# Patient Record
Sex: Female | Born: 2000 | Race: Black or African American | Hispanic: No | Marital: Single | Smoking: Former smoker
Health system: Southern US, Community
[De-identification: ages and names within clinical notes are randomized; demographics above are authoritative.]

## PROBLEM LIST (undated history)

## (undated) DIAGNOSIS — D649 Anemia, unspecified: Secondary | ICD-10-CM

## (undated) HISTORY — DX: Anemia, unspecified: D64.9

## (undated) HISTORY — PX: NO PAST SURGERIES: SHX2092

---
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
## 2019-12-03 ENCOUNTER — Ambulatory Visit (HOSPITAL_COMMUNITY)
Admission: EM | Admit: 2019-12-03 | Discharge: 2019-12-03 | Disposition: A | Discharge status: Home / Self Care | Payer: BC Managed Care – PPO | Attending: Emergency Medicine | Admitting: Emergency Medicine

## 2019-12-03 ENCOUNTER — Other Ambulatory Visit: Payer: Self-pay

## 2019-12-03 ENCOUNTER — Encounter (HOSPITAL_COMMUNITY): Payer: Self-pay

## 2019-12-03 DIAGNOSIS — N390 Urinary tract infection, site not specified: Secondary | ICD-10-CM | POA: Diagnosis not present

## 2019-12-03 DIAGNOSIS — Z3202 Encounter for pregnancy test, result negative: Secondary | ICD-10-CM

## 2019-12-03 DIAGNOSIS — R300 Dysuria: Secondary | ICD-10-CM

## 2019-12-03 LAB — POCT URINALYSIS DIP (DEVICE)
Bilirubin Urine: NEGATIVE
Glucose, UA: 250 mg/dL — AB
Hgb urine dipstick: NEGATIVE
Leukocytes,Ua: NEGATIVE
Nitrite: POSITIVE — AB
Protein, ur: NEGATIVE mg/dL
Specific Gravity, Urine: 1.02 (ref 1.005–1.030)
Urobilinogen, UA: 4 mg/dL — ABNORMAL HIGH (ref 0.0–1.0)
pH: 7 (ref 5.0–8.0)

## 2019-12-03 LAB — POC URINE PREG, ED: Preg Test, Ur: NEGATIVE

## 2019-12-03 MED ORDER — IBUPROFEN 800 MG PO TABS
800.0000 mg | ORAL_TABLET | Freq: Three times a day (TID) | ORAL | 0 refills | Status: DC
Start: 2019-12-03 — End: 2021-03-12

## 2019-12-03 MED ORDER — CEPHALEXIN 500 MG PO CAPS: 500.0000 mg | ORAL_CAPSULE | Freq: Four times a day (QID) | ORAL | 0 refills | Status: AC

## 2019-12-03 NOTE — Discharge Instructions (Signed)
Drink plenty of water to empty bladder regularly. Avoid alcohol and caffeine as these may irritate the bladder.   Complete course of antibiotics.  We will call you if your culture indicates any need to change antibiotics.  I do recommend following up with gynecology to establish care and follow up

## 2019-12-03 NOTE — ED Triage Notes (Signed)
Pt c/o dysuria sx for approx 1 week, urgency, frequency and pt states she has been getting UTIs each month for several months.   Also reports lower abdominal/pelvic pain last night.  Denies fever, chills, flank/back pain, n/v/d.  Reports she was given Rx last week and thought she was only supposed to take 1 pill/daily, and reports symptoms persisted. Pt states she had online consult and was given new RX of Bactrim on 11/28/19.

## 2019-12-04 LAB — URINE CULTURE: Culture: NO GROWTH

## 2019-12-04 NOTE — ED Provider Notes (Signed)
[facility]    CSN: [id number] Arrival date & time: 12/03/19  1125      History   Chief Complaint Chief Complaint  Patient presents with  . Dysuria    HPI Alexandra Lyons is a 19 y.o. female.   Alexandra Lyons presents with complaints of dysuria and urinary urgency which started worsened last night. She started on bactrim on 7/25 following a telehealth visit, for urinary symptoms. She has had a uti every month for the past three months, she has done a telehealth visit each time and received a variety of different antibiotics. Feels like the bactrim is not helping this time, however. No back pain. No fevers. No diarrhea. No abdominal pain. No nausea or vomiting. She has been taking AZO for pain. Doesn't have regular periods. She is sexually active. She doesn't have a PCP.    ROS per HPI, negative if not otherwise mentioned.      Past Medical History:  Diagnosis Date  . Anemia     There are no problems to display for this patient.   History reviewed. No pertinent surgical history.  OB History   No obstetric history on file.      Home Medications    Prior to Admission medications   Medication Sig Start Date End Date Taking? Authorizing Provider  sulfamethoxazole-trimethoprim (BACTRIM) 200-40 MG/5ML suspension Take by mouth 2 (two) times daily.   Yes [provider]  cephALEXin (KEFLEX) 500 MG capsule Take 1 capsule (500 mg total) by mouth 4 (four) times daily for 7 days. 12/03/19 12/10/19  Linus Mako B, NP  ibuprofen (ADVIL) 800 MG tablet Take 1 tablet (800 mg total) by mouth 3 (three) times daily. 12/03/19   Georgetta Haber, NP  phenazopyridine (PYRIDIUM) 200 MG tablet Take 1 tablet (200 mg total) by mouth 3 (three) times daily. 06/16/17   Mardella Layman, MD    Family History Family History  Problem Relation Age of Onset  . Diabetes Paternal Grandmother   . Brain cancer Other     Social History Social History   Tobacco Use  .  Smoking status: Never Smoker  . Smokeless tobacco: Never Used  Substance Use Topics  . Alcohol use: Yes  . Drug use: No     Allergies   Patient has no known allergies.   Review of Systems Review of Systems   Physical Exam Triage Vital Signs ED Triage Vitals  Enc Vitals Group     BP 12/03/19 1238 (!) 105/54     Pulse Rate 12/03/19 1238 70     Resp 12/03/19 1238 18     Temp 12/03/19 1238 98.4 F (36.9 C)     Temp Source 12/03/19 1238 Oral     SpO2 12/03/19 1238 100 %     Weight --      Height --      Head Circumference --      Peak Flow --      Pain Score 12/03/19 1235 7     Pain Loc --      Pain Edu? --      Excl. in GC? --    No data found.  Updated Vital Signs BP (!) 105/54 (BP Location: Right Arm)   Pulse 70   Temp 98.4 F (36.9 C) (Oral)   Resp 18   LMP 07/05/2019 (Approximate)   SpO2 100%   Visual Acuity Right Eye Distance:   Left Eye Distance:   Bilateral Distance:  Right Eye Near:   Left Eye Near:    Bilateral Near:     Physical Exam Constitutional:      General: She is not in acute distress.    Appearance: She is well-developed.  Cardiovascular:     Rate and Rhythm: Normal rate.  Pulmonary:     Effort: Pulmonary effort is normal.  Abdominal:     Tenderness: There is no right CVA tenderness or left CVA tenderness.  Skin:    General: Skin is warm and dry.  Neurological:     Mental Status: She is alert and oriented to person, place, and time.      UC Treatments / Results  Labs (all labs ordered are listed, but only abnormal results are displayed) Labs Reviewed  POCT URINALYSIS DIP (DEVICE) - Abnormal; Notable for the following components:      Result Value   Glucose, UA 250 (*)    Ketones, ur TRACE (*)    Urobilinogen, UA 4.0 (*)    Nitrite POSITIVE (*)    All other components within normal limits  URINE CULTURE  POC URINE PREG, ED    EKG   Radiology No results found.  Procedures Procedures (including critical care  time)  Medications Ordered in UC Medications - No data to display  Initial Impression / Assessment and Plan / UC Course  I have reviewed the triage vital signs and the nursing notes.  Pertinent labs & imaging results that were available during my care of the patient were reviewed by me and considered in my medical decision making (see chart for details).     Has been taking bactrim for 5 days without any improvement, and with worsening of urinary symptoms, still with nitrite to urine. Will switch to keflex with culture pending. Return precautions provided. Patient verbalized understanding and agreeable to plan.  Encouraged establish with PCP.  Final Clinical Impressions(s) / UC Diagnoses   Final diagnoses:  Lower urinary tract infectious disease     Discharge Instructions     Drink plenty of water to empty bladder regularly. Avoid alcohol and caffeine as these may irritate the bladder.   Complete course of antibiotics.  We will call you if your culture indicates any need to change antibiotics.  I do recommend following up with gynecology to establish care and follow up   ED Prescriptions    Medication Sig Dispense Auth. Provider   cephALEXin (KEFLEX) 500 MG capsule Take 1 capsule (500 mg total) by mouth 4 (four) times daily for 7 days. 28 capsule Linus Mako B, NP   ibuprofen (ADVIL) 800 MG tablet Take 1 tablet (800 mg total) by mouth 3 (three) times daily. 30 tablet Georgetta Haber, NP     PDMP not reviewed this encounter.   Georgetta Haber, NP 12/04/19 2110

## 2020-10-31 NOTE — Progress Notes (Signed)
11:40 a-Called Pt for New OB Intake, no answer, left VM.This encounter was created in error - please disregard.

## 2020-11-23 ENCOUNTER — Ambulatory Visit (INDEPENDENT_AMBULATORY_CARE_PROVIDER_SITE_OTHER): Payer: Medicaid Other | Admitting: Obstetrics and Gynecology

## 2020-11-23 ENCOUNTER — Other Ambulatory Visit: Payer: Self-pay

## 2020-11-23 ENCOUNTER — Encounter: Payer: Self-pay | Admitting: Obstetrics and Gynecology

## 2020-11-23 ENCOUNTER — Other Ambulatory Visit (HOSPITAL_COMMUNITY)
Admission: RE | Admit: 2020-11-23 | Discharge: 2020-11-23 | Disposition: A | Discharge status: Home / Self Care | Payer: Medicaid Other | Source: Ambulatory Visit | Attending: Obstetrics and Gynecology | Admitting: Obstetrics and Gynecology

## 2020-11-23 VITALS — BP 108/73 | HR 84 | Wt 179.7 lb

## 2020-11-23 DIAGNOSIS — Z3403 Encounter for supervision of normal first pregnancy, third trimester: Secondary | ICD-10-CM | POA: Insufficient documentation

## 2020-11-23 DIAGNOSIS — Z3201 Encounter for pregnancy test, result positive: Secondary | ICD-10-CM

## 2020-11-23 DIAGNOSIS — Z3480 Encounter for supervision of other normal pregnancy, unspecified trimester: Secondary | ICD-10-CM | POA: Diagnosis not present

## 2020-11-23 DIAGNOSIS — Z5941 Food insecurity: Secondary | ICD-10-CM | POA: Diagnosis not present

## 2020-11-23 LAB — POCT PREGNANCY, URINE: Preg Test, Ur: POSITIVE — AB

## 2020-11-23 NOTE — Addendum Note (Signed)
Addended by: Guy Begin on: 11/23/2020 04:32 PM   Modules accepted: Orders

## 2020-11-23 NOTE — Progress Notes (Signed)
  Subjective:    Alexandra Lyons is a G1P0 [redacted]w[redacted]d being seen today for her first obstetrical visit.  Her obstetrical history is significant for first pregnancy. Patient does intend to breast feed. Pregnancy history fully reviewed.  Patient reports no complaints.  Vitals:   11/23/20 1010  BP: 108/73  Pulse: 84  Weight: 179 lb 11.2 oz (81.5 kg)    HISTORY: OB History  Gravida Para Term Preterm AB Living  1            SAB IAB Ectopic Multiple Live Births               # Outcome Date GA Lbr Len/2nd Weight Sex Delivery Anes PTL Lv  1 Current            Past Medical History:  Diagnosis Date   Anemia    Past Surgical History:  Procedure Laterality Date   NO PAST SURGERIES     Family History  Problem Relation Age of Onset   Diabetes Paternal Grandmother    Brain cancer Other      Exam    Uterus:     Pelvic Exam:    Perineum: No Hemorrhoids, Normal Perineum   Vulva: normal   Vagina:  normal mucosa, normal discharge   pH:    Cervix: nulliparous appearance and closed and long   Adnexa: normal adnexa and no mass, fullness, tenderness   Bony Pelvis: gynecoid  System: Breast:  normal appearance, no masses or tenderness   Skin: normal coloration and turgor, no rashes    Neurologic: oriented, no focal deficits   Extremities: normal strength, tone, and muscle mass   HEENT extra ocular movement intact   Mouth/Teeth mucous membranes moist, pharynx normal without lesions and dental hygiene good   Neck supple and no masses   Cardiovascular: regular rate and rhythm   Respiratory:  appears well, vitals normal, no respiratory distress, acyanotic, normal RR, chest clear, no wheezing, crepitations, rhonchi, normal symmetric air entry   Abdomen: soft, non-tender; bowel sounds normal; no masses,  no organomegaly   Urinary:       Assessment:    Pregnancy: G1P0 There are no problems to display for this patient.       Plan:     Initial labs drawn. Prenatal  vitamins. Problem list reviewed and updated. Genetic Screening discussed : panorama ordered.  Ultrasound discussed; fetal survey: ordered.  Follow up in 4 weeks. 50% of 30 min visit spent on counseling and coordination of care.     Janelly Switalski 11/23/2020

## 2020-11-23 NOTE — Patient Instructions (Signed)
Second Trimester of Pregnancy °The second trimester of pregnancy is from week 13 through week 27. This is months 4 through 6 of pregnancy. The second trimester is often a time when you feel your best. Your body has adjusted to being pregnant, and you begin to feel better physically. °During the second trimester: °Morning sickness has lessened or stopped completely. °You may have more energy. °You may have an increase in appetite. °The second trimester is also a time when the unborn baby (fetus) is growing rapidly. At the end of the sixth month, the fetus may be up to 12 inches long and weigh about 1½ pounds. You will likely begin to feel the baby move (quickening) between 16 and 20 weeks of pregnancy. °Body changes during your second trimester °Your body continues to go through many changes during your second trimester. The changes vary and generally return to normal after the baby is born. °Physical changes °Your weight will continue to increase. You will notice your lower abdomen bulging out. °You may begin to get stretch marks on your hips, abdomen, and breasts. °Your breasts will continue to grow and to become tender. °Dark spots or blotches (chloasma or mask of pregnancy) may develop on your face. °A dark line from your belly button to the pubic area (linea nigra) may appear. °You may have changes in your hair. These can include thickening of your hair, rapid growth, and changes in texture. Some people also have hair loss during or after pregnancy, or hair that feels dry or thin. °Health changes °You may develop headaches. °You may have heartburn. °You may develop constipation. °You may develop hemorrhoids or swollen, bulging veins (varicose veins). °Your gums may bleed and may be sensitive to brushing and flossing. °You may urinate more often because the fetus is pressing on your bladder. °You may have back pain. This is caused by: °Weight gain. °Pregnancy hormones that are relaxing the joints in your  pelvis. °A shift in weight and the muscles that support your balance. °Follow these instructions at home: °Medicines °Follow your health care provider's instructions regarding medicine use. Specific medicines may be either safe or unsafe to take during pregnancy. Do not take any medicines unless approved by your health care provider. °Take a prenatal vitamin that contains at least 600 micrograms (mcg) of folic acid. °Eating and drinking °Eat a healthy diet that includes fresh fruits and vegetables, whole grains, good sources of protein such as meat, eggs, or tofu, and low-fat dairy products. °Avoid raw meat and unpasteurized juice, milk, and cheese. These carry germs that can harm you and your baby. °You may need to take these actions to prevent or treat constipation: °Drink enough fluid to keep your urine pale yellow. °Eat foods that are high in fiber, such as beans, whole grains, and fresh fruits and vegetables. °Limit foods that are high in fat and processed sugars, such as fried or sweet foods. °Activity °Exercise only as directed by your health care provider. Most people can continue their usual exercise routine during pregnancy. Try to exercise for 30 minutes at least 5 days a week. Stop exercising if you develop contractions in your uterus. °Stop exercising if you develop pain or cramping in the lower abdomen or lower back. °Avoid exercising if it is very hot or humid or if you are at a high altitude. °Avoid heavy lifting. °If you choose to, you may have sex unless your health care provider tells you not to. °Relieving pain and discomfort °Wear a supportive bra   to prevent discomfort from breast tenderness. °Take warm sitz baths to soothe any pain or discomfort caused by hemorrhoids. Use hemorrhoid cream if your health care provider approves. °Rest with your legs raised (elevated) if you have leg cramps or low back pain. °If you develop varicose veins: °Wear support hose as told by your health care  provider. °Elevate your feet for 15 minutes, 3-4 times a day. °Limit salt in your diet. °Safety °Wear your seat belt at all times when driving or riding in a car. °Talk with your health care provider if someone is verbally or physically abusive to you. °Lifestyle °Do not use hot tubs, steam rooms, or saunas. °Do not douche. Do not use tampons or scented sanitary pads. °Avoid cat litter boxes and soil used by cats. These carry germs that can cause birth defects in the baby and possibly loss of the fetus by miscarriage or stillbirth. °Do not use herbal remedies, alcohol, illegal drugs, or medicines that are not approved by your health care provider. Chemicals in these products can harm your baby. °Do not use any products that contain nicotine or tobacco, such as cigarettes, e-cigarettes, and chewing tobacco. If you need help quitting, ask your health care provider. °General instructions °During a routine prenatal visit, your health care provider will do a physical exam and other tests. He or she will also discuss your overall health. Keep all follow-up visits. This is important. °Ask your health care provider for a referral to a local prenatal education class. °Ask for help if you have counseling or nutritional needs during pregnancy. Your health care provider can offer advice or refer you to specialists for help with various needs. °Where to find more information °American Pregnancy Association: americanpregnancy.org °American College of Obstetricians and Gynecologists: acog.org/en/Womens%20Health/Pregnancy °Office on Women's Health: womenshealth.gov/pregnancy °Contact a health care provider if you have: °A headache that does not go away when you take medicine. °Vision changes or you see spots in front of your eyes. °Mild pelvic cramps, pelvic pressure, or nagging pain in the abdominal area. °Persistent nausea, vomiting, or diarrhea. °A bad-smelling vaginal discharge or foul-smelling urine. °Pain when you  urinate. °Sudden or extreme swelling of your face, hands, ankles, feet, or legs. °A fever. °Get help right away if you: °Have fluid leaking from your vagina. °Have spotting or bleeding from your vagina. °Have severe abdominal cramping or pain. °Have difficulty breathing. °Have chest pain. °Have fainting spells. °Have not felt your baby move for the time period told by your health care provider. °Have new or increased pain, swelling, or redness in an arm or leg. °Summary °The second trimester of pregnancy is from week 13 through week 27 (months 4 through 6). °Do not use herbal remedies, alcohol, illegal drugs, or medicines that are not approved by your health care provider. Chemicals in these products can harm your baby. °Exercise only as directed by your health care provider. Most people can continue their usual exercise routine during pregnancy. °Keep all follow-up visits. This is important. °This information is not intended to replace advice given to you by your health care provider. Make sure you discuss any questions you have with your health care provider. °Document Revised: 09/29/2019 Document Reviewed: 08/05/2019 °Elsevier Patient Education © 2022 [employer] ° °Contraception Choices °Contraception, also called birth control, refers to methods or devices that prevent pregnancy. °Hormonal methods °Contraceptive implant °A contraceptive implant is a thin, plastic tube that contains a hormone that prevents pregnancy. It is different from an intrauterine device (IUD). It   is inserted into the upper part of the arm by a health care provider. Implants can be effective for up to 3 years. °Progestin-only injections °Progestin-only injections are injections of progestin, a synthetic form of the hormone progesterone. They are given every 3 months by a health care provider. °Birth control pills °Birth control pills are pills that contain hormones that prevent pregnancy. They must be taken once a day, preferably at the  same time each day. A prescription is needed to use this method of contraception. °Birth control patch °The birth control patch contains hormones that prevent pregnancy. It is placed on the skin and must be changed once a week for three weeks and removed on the fourth week. A prescription is needed to use this method of contraception. °Vaginal ring °A vaginal ring contains hormones that prevent pregnancy. It is placed in the vagina for three weeks and removed on the fourth week. After that, the process is repeated with a new ring. A prescription is needed to use this method of contraception. °Emergency contraceptive °Emergency contraceptives prevent pregnancy after unprotected sex. They come in pill form and can be taken up to 5 days after sex. They work best the sooner they are taken after having sex. Most emergency contraceptives are available without a prescription. This method should not be used as your only form of birth control. °Barrier methods °Female condom °A female condom is a thin sheath that is worn over the penis during sex. Condoms keep sperm from going inside a woman's body. They can be used with a sperm-killing substance (spermicide) to increase their effectiveness. They should be thrown away after one use. °Female condom °A female condom is a soft, loose-fitting sheath that is put into the vagina before sex. The condom keeps sperm from going inside a woman's body. They should be thrown away after one use. °Diaphragm °A diaphragm is a soft, dome-shaped barrier. It is inserted into the vagina before sex, along with a spermicide. The diaphragm blocks sperm from entering the uterus, and the spermicide kills sperm. A diaphragm should be left in the vagina for 6-8 hours after sex and removed within 24 hours. °A diaphragm is prescribed and fitted by a health care provider. A diaphragm should be replaced every 1-2 years, after giving birth, after gaining more than 15 lb (6.8 kg), and after pelvic  surgery. °Cervical cap °A cervical cap is a round, soft latex or plastic cup that fits over the cervix. It is inserted into the vagina before sex, along with spermicide. It blocks sperm from entering the uterus. The cap should be left in place for 6-8 hours after sex and removed within 48 hours. A cervical cap must be prescribed and fitted by a health care provider. It should be replaced every 2 years. °Sponge °A sponge is a soft, circular piece of polyurethane foam with spermicide in it. The sponge helps block sperm from entering the uterus, and the spermicide kills sperm. To use it, you make it wet and then insert it into the vagina. It should be inserted before sex, left in for at least 6 hours after sex, and removed and thrown away within 30 hours. °Spermicides °Spermicides are chemicals that kill or block sperm from entering the cervix and uterus. They can come as a cream, jelly, suppository, foam, or tablet. A spermicide should be inserted into the vagina with an applicator at least 10-15 minutes before sex to allow time for it to work. The process must be repeated every time   you have sex. Spermicides do not require a prescription. °Intrauterine contraception °Intrauterine device (IUD) °An IUD is a T-shaped device that is put in a woman's uterus. There are two types: °Hormone IUD.This type contains progestin, a synthetic form of the hormone progesterone. This type can stay in place for 3-5 years. °Copper IUD.This type is wrapped in copper wire. It can stay in place for 10 years. °Permanent methods of contraception °Female tubal ligation °In this method, a woman's fallopian tubes are sealed, tied, or blocked during surgery to prevent eggs from traveling to the uterus. °Hysteroscopic sterilization °In this method, a small, flexible insert is placed into each fallopian tube. The inserts cause scar tissue to form in the fallopian tubes and block them, so sperm cannot reach an egg. The procedure takes about 3  months to be effective. Another form of birth control must be used during those 3 months. °Female sterilization °This is a procedure to tie off the tubes that carry sperm (vasectomy). After the procedure, the man can still ejaculate fluid (semen). Another form of birth control must be used for 3 months after the procedure. °Natural planning methods °Natural family planning °In this method, a couple does not have sex on days when the woman could become pregnant. °Calendar method °In this method, the woman keeps track of the length of each menstrual cycle, identifies the days when pregnancy can happen, and does not have sex on those days. °Ovulation method °In this method, a couple avoids sex during ovulation. °Symptothermal method °This method involves not having sex during ovulation. The woman typically checks for ovulation by watching changes in her temperature and in the consistency of cervical mucus. °Post-ovulation method °In this method, a couple waits to have sex until after ovulation. °Where to find more information °Centers for Disease Control and Prevention: www.cdc.gov °Summary °Contraception, also called birth control, refers to methods or devices that prevent pregnancy. °Hormonal methods of contraception include implants, injections, pills, patches, vaginal rings, and emergency contraceptives. °Barrier methods of contraception can include female condoms, female condoms, diaphragms, cervical caps, sponges, and spermicides. °There are two types of IUDs (intrauterine devices). An IUD can be put in a woman's uterus to prevent pregnancy for 3-5 years. °Permanent sterilization can be done through a procedure for males and females. Natural family planning methods involve nothaving sex on days when the woman could become pregnant. °This information is not intended to replace advice given to you by your health care provider. Make sure you discuss any questions you have with your health care provider. °Document  Revised: 09/27/2019 Document Reviewed: 09/27/2019 °Elsevier Patient Education © 2022 Elsevier Inc. ° °

## 2020-11-24 ENCOUNTER — Encounter: Payer: Self-pay | Admitting: *Deleted

## 2020-11-24 LAB — CBC/D/PLT+RPR+RH+ABO+RUBIGG...
Antibody Screen: NEGATIVE
Basophils Absolute: 0 10*3/uL (ref 0.0–0.2)
Basos: 0 %
EOS (ABSOLUTE): 0 10*3/uL (ref 0.0–0.4)
Eos: 0 %
HCV Ab: <0.1 s/co ratio (ref 0.0–0.9)
HIV Screen 4th Generation wRfx: NONREACTIVE
Hematocrit: 31.3 % — ABNORMAL LOW (ref 34.0–46.6)
Hemoglobin: 10.7 g/dL — ABNORMAL LOW (ref 11.1–15.9)
Hepatitis B Surface Ag: NEGATIVE
Immature Grans (Abs): 0 10*3/uL (ref 0.0–0.1)
Immature Granulocytes: 0 %
Lymphocytes Absolute: 0.9 10*3/uL (ref 0.7–3.1)
Lymphs: 21 %
MCH: 28.5 pg (ref 26.6–33.0)
MCHC: 34.2 g/dL (ref 31.5–35.7)
MCV: 84 fL (ref 79–97)
Monocytes Absolute: 0.3 10*3/uL (ref 0.1–0.9)
Monocytes: 6 %
Neutrophils Absolute: 3 10*3/uL (ref 1.4–7.0)
Neutrophils: 73 %
Platelets: 200 10*3/uL (ref 150–450)
RBC: 3.75 x10E6/uL — ABNORMAL LOW (ref 3.77–5.28)
RDW: 13 % (ref 11.7–15.4)
RPR Ser Ql: NONREACTIVE
Rh Factor: POSITIVE
Rubella Antibodies, IGG: 2.77 index (ref >0.99)
WBC: 4.1 10*3/uL (ref 3.4–10.8)

## 2020-11-24 LAB — HCV INTERPRETATION

## 2020-11-24 LAB — CERVICOVAGINAL ANCILLARY ONLY
Chlamydia: NEGATIVE
Comment: NEGATIVE
Comment: NORMAL
Neisseria Gonorrhea: NEGATIVE

## 2020-11-24 LAB — HEMOGLOBIN A1C
Est. average glucose Bld gHb Est-mCnc: 111 mg/dL
Hgb A1c MFr Bld: 5.5 % (ref 4.8–5.6)

## 2020-11-26 LAB — CULTURE, OB URINE

## 2020-11-26 LAB — URINE CULTURE, OB REFLEX

## 2020-11-27 LAB — CYTOLOGY - PAP: Diagnosis: NEGATIVE

## 2020-12-21 ENCOUNTER — Ambulatory Visit (INDEPENDENT_AMBULATORY_CARE_PROVIDER_SITE_OTHER): Payer: Medicaid Other | Admitting: Obstetrics and Gynecology

## 2020-12-21 ENCOUNTER — Other Ambulatory Visit: Payer: Self-pay

## 2020-12-21 VITALS — BP 91/51 | HR 90 | Wt 190.2 lb

## 2020-12-21 DIAGNOSIS — Z3480 Encounter for supervision of other normal pregnancy, unspecified trimester: Secondary | ICD-10-CM

## 2020-12-21 DIAGNOSIS — Z3A17 17 weeks gestation of pregnancy: Secondary | ICD-10-CM

## 2020-12-21 MED ORDER — NITROFURANTOIN MONOHYD MACRO 100 MG PO CAPS: 100.0000 mg | ORAL_CAPSULE | Freq: Two times a day (BID) | ORAL | 0 refills | Status: AC

## 2020-12-21 NOTE — Progress Notes (Signed)
   PRENATAL VISIT NOTE  Subjective:  Alexandra Lyons is a 20 y.o. G1P0 at [redacted]w[redacted]d being seen today for ongoing prenatal care.  She is currently monitored for the following issues for this low-risk pregnancy and has Supervision of other normal pregnancy, antepartum on their problem list.  Patient reports dysuria. Concerned that her urine culture in July was positive and she was not given antibiotics. Continues to have dysuria and pain in the LLQ at times after urinating. She has no flank pain or fever.  Contractions: Not present. Vag. Bleeding: None.  Movement: Absent. Denies leaking of fluid.   The following portions of the patient's history were reviewed and updated as appropriate: allergies, current medications, past family history, past medical history, past social history, past surgical history and problem list.   Objective:   Vitals:   12/21/20 0847  BP: (!) 91/51  Pulse: 90  Weight: 190 lb 3.2 oz (86.3 kg)    Fetal Status: Fetal Heart Rate (bpm): 137   Movement: Absent     General:  Alert, oriented and cooperative. Patient is in no acute distress.  Skin: Skin is warm and dry. No rash noted.   Cardiovascular: Normal heart rate noted  Respiratory: Normal respiratory effort, no problems with respiration noted  Abdomen: Soft, gravid, appropriate for gestational age.  Pain/Pressure: Present     Pelvic: Cervical exam deferred        Extremities: Normal range of motion.  Edema: None  Mental Status: Normal mood and affect. Normal behavior. Normal judgment and thought content.   Assessment and Plan:  Pregnancy: G1P0 at [redacted]w[redacted]d  1. Supervision of other normal pregnancy, antepartum  - AFP, Serum, Open Spina Bifida  - Urine culture positive on 7/21- Patient continues to have dysuria and pain in her LLQ at times after urinating. - Urine culture reviewed, Rx Macrobid.   Preterm labor symptoms and general obstetric precautions including but not limited to vaginal bleeding,  contractions, leaking of fluid and fetal movement were reviewed in detail with the patient. Please refer to After Visit Summary for other counseling recommendations.   No follow-ups on file.  Future Appointments  Date Time Provider Department Center  01/01/2021  8:45 AM [facility] US[facility]  01/19/2021  9:15 AM Adam Phenix, MD [facility]    Venia Carbon, NP

## 2020-12-22 LAB — AFP, SERUM, OPEN SPINA BIFIDA
AFP MoM: 1.49
AFP Value: 56.4 ng/mL
Gest. Age on Collection Date: 17 weeks
Maternal Age At EDD: 22.7 yr
OSBR Risk 1 IN: 5617
Test Results:: NEGATIVE
Weight: 190 [lb_av]

## 2021-01-01 ENCOUNTER — Other Ambulatory Visit: Payer: Self-pay

## 2021-01-01 ENCOUNTER — Other Ambulatory Visit: Payer: Self-pay | Admitting: *Deleted

## 2021-01-01 ENCOUNTER — Ambulatory Visit: Payer: Medicaid Other | Attending: Obstetrics and Gynecology

## 2021-01-01 DIAGNOSIS — Z3480 Encounter for supervision of other normal pregnancy, unspecified trimester: Secondary | ICD-10-CM | POA: Diagnosis present

## 2021-01-01 DIAGNOSIS — Z362 Encounter for other antenatal screening follow-up: Secondary | ICD-10-CM

## 2021-01-05 ENCOUNTER — Telehealth: Payer: Self-pay | Admitting: Lactation Services

## 2021-01-05 NOTE — Telephone Encounter (Signed)
Called patient and informed that she is a silent carrier for Alpha Thalassemia.   Reviewed calling [facility] at [phone] to schedule a telephone Genetic Counseling Session to discuss results.   Reviewed partner testing in recommended and we have kits in the office that partner can either come to the office at her next appointment to complete or can take home for him to complete.   Patient with no further questions or concerns today.

## 2021-01-19 ENCOUNTER — Other Ambulatory Visit: Payer: Self-pay

## 2021-01-19 ENCOUNTER — Ambulatory Visit (INDEPENDENT_AMBULATORY_CARE_PROVIDER_SITE_OTHER): Payer: Medicaid Other | Admitting: Obstetrics & Gynecology

## 2021-01-19 VITALS — BP 103/63 | HR 91 | Wt 194.2 lb

## 2021-01-19 DIAGNOSIS — Z3480 Encounter for supervision of other normal pregnancy, unspecified trimester: Secondary | ICD-10-CM

## 2021-01-19 DIAGNOSIS — Z3A21 21 weeks gestation of pregnancy: Secondary | ICD-10-CM

## 2021-01-19 NOTE — Progress Notes (Signed)
I, Valetta Close Erskine Steinfeldt, LCSW, have reviewed all documentation for this visit. The documentation on 01/19/21 for the exam, diagnosis, procedures, and orders are all accurate and complete.

## 2021-01-19 NOTE — Progress Notes (Signed)
Pt states has been constipated off & on.

## 2021-01-19 NOTE — Progress Notes (Signed)
   PRENATAL VISIT NOTE  Subjective:  Alexandra Lyons is a 20 y.o. G1P0 at [redacted]w[redacted]d being seen today for ongoing prenatal care.  She is currently monitored for the following issues for this low-risk pregnancy and has Supervision of other normal pregnancy, antepartum on their problem list.  Patient reports  constipation she manages with diet .  Contractions: Not present. Vag. Bleeding: None.  Movement: Present. Denies leaking of fluid.   The following portions of the patient's history were reviewed and updated as appropriate: allergies, current medications, past family history, past medical history, past social history, past surgical history and problem list.   Objective:   Vitals:   01/19/21 0938  BP: 103/63  Pulse: 91  Weight: 194 lb 3.2 oz (88.1 kg)    Fetal Status: Fetal Heart Rate (bpm): 144   Movement: Present     General:  Alert, oriented and cooperative. Patient is in no acute distress.  Skin: Skin is warm and dry. No rash noted.   Cardiovascular: Normal heart rate noted  Respiratory: Normal respiratory effort, no problems with respiration noted  Abdomen: Soft, gravid, appropriate for gestational age.  Pain/Pressure: Present     Pelvic: Cervical exam deferred        Extremities: Normal range of motion.  Edema: None  Mental Status: Normal mood and affect. Normal behavior. Normal judgment and thought content.   Assessment and Plan:  Pregnancy: G1P0 at [redacted]w[redacted]d 1. Supervision of other normal pregnancy, antepartum Constipation discussed and instructions were given  Preterm labor symptoms and general obstetric precautions including but not limited to vaginal bleeding, contractions, leaking of fluid and fetal movement were reviewed in detail with the patient. Please refer to After Visit Summary for other counseling recommendations.   Return in about 4 weeks (around 02/16/2021).  Future Appointments  Date Time Provider Department Center  01/30/2021  7:30 AM [facility] NURSE  [facility]  01/30/2021  7:45 AM [facility] US5 [facility]    Scheryl Darter, MD

## 2021-01-30 ENCOUNTER — Other Ambulatory Visit: Payer: Self-pay

## 2021-01-30 ENCOUNTER — Ambulatory Visit: Payer: Medicaid Other

## 2021-01-30 ENCOUNTER — Ambulatory Visit: Payer: Medicaid Other | Attending: Obstetrics and Gynecology

## 2021-01-30 DIAGNOSIS — Z3A23 23 weeks gestation of pregnancy: Secondary | ICD-10-CM

## 2021-01-30 DIAGNOSIS — Z362 Encounter for other antenatal screening follow-up: Secondary | ICD-10-CM | POA: Diagnosis present

## 2021-02-15 ENCOUNTER — Other Ambulatory Visit: Payer: Medicaid Other

## 2021-02-15 ENCOUNTER — Other Ambulatory Visit: Payer: Self-pay

## 2021-02-15 ENCOUNTER — Encounter: Payer: Medicaid Other | Admitting: Obstetrics & Gynecology

## 2021-02-15 DIAGNOSIS — Z3480 Encounter for supervision of other normal pregnancy, unspecified trimester: Secondary | ICD-10-CM

## 2021-03-12 ENCOUNTER — Encounter: Payer: Self-pay | Admitting: Nurse Practitioner

## 2021-03-12 ENCOUNTER — Ambulatory Visit (INDEPENDENT_AMBULATORY_CARE_PROVIDER_SITE_OTHER): Payer: Medicaid Other | Admitting: Nurse Practitioner

## 2021-03-12 ENCOUNTER — Other Ambulatory Visit: Payer: Medicaid Other

## 2021-03-12 ENCOUNTER — Other Ambulatory Visit: Payer: Self-pay

## 2021-03-12 VITALS — BP 123/69 | HR 87 | Wt 208.2 lb

## 2021-03-12 DIAGNOSIS — Z3480 Encounter for supervision of other normal pregnancy, unspecified trimester: Secondary | ICD-10-CM

## 2021-03-12 DIAGNOSIS — Z23 Encounter for immunization: Secondary | ICD-10-CM | POA: Diagnosis not present

## 2021-03-12 DIAGNOSIS — Z3A29 29 weeks gestation of pregnancy: Secondary | ICD-10-CM

## 2021-03-12 DIAGNOSIS — R8279 Other abnormal findings on microbiological examination of urine: Secondary | ICD-10-CM

## 2021-03-12 DIAGNOSIS — F338 Other recurrent depressive disorders: Secondary | ICD-10-CM | POA: Insufficient documentation

## 2021-03-12 NOTE — Patient Instructions (Addendum)
[email or web address] - childbirth and breastfeeding classes     BRAINSTORMING  Develop a Plan Goals: Provide a way to start conversation about your new life with a baby Assist parents in recognizing and using resources within their reach Help pave the way before birth for an easier period of transition afterwards.  Make a list of the following information to keep in a central location: Full name of Mom and Partner: _____________________________________________ Baby's full name and Date of Birth: ___________________________________________ Home Address: ___________________________________________________________ ________________________________________________________________________ Home Phone: ____________________________________________________________ Parents' cell numbers: _____________________________________________________ ________________________________________________________________________ Name and contact info for OB: ______________________________________________ Name and contact info for Pediatrician:________________________________________ Contact info for Lactation Consultants: ________________________________________  REST and SLEEP *You each need at least 4-5 hours of uninterrupted sleep every day. Write specific names and contact information.* How are you going to rest in the postpartum period? While partner's home? When partner returns to work? When you both return to work? Where will your baby sleep? Who is available to help during the day? Evening? Night? Who could move in for a period to help support you? What are some ideas to help you get enough sleep? __________________________________________________________________________________________________________________________________________________________________________________________________________________________________________ NUTRITIOUS FOOD AND DRINK *Plan for meals before your baby is born so you  can have healthy food to eat during the immediate postpartum period.* Who will look after breakfast? Lunch? Dinner? List names and contact information. Brainstorm quick, healthy ideas for each meal. What can you do before baby is born to prepare meals for the postpartum period? How can others help you with meals? Which grocery stores provide online shopping and delivery? Which restaurants offer take-out or delivery options? ______________________________________________________________________________________________________________________________________________________________________________________________________________________________________________________________________________________________________________________________________________________________________________________________________  CARE FOR MOM *It's important that mom is cared for and pampered in the postpartum period. Remember, the most important ways new mothers need care are: sleep, nutrition, gentle exercise, and time off.* Who can come take care of mom during this period? Make a list of people with their contact information. List some activities that make you feel cared for, rested, and energized? Who can make sure you have opportunities to do these things? Does mom have a space of her very own within your home that's just for her? Make a "Mama Cave" where she can be comfortable, rest, and renew herself daily. ______________________________________________________________________________________________________________________________________________________________________________________________________________________________________________________________________________________________________________________________________________________________________________________________________    CARE FOR AND FEEDING BABY *Knowledgeable and encouraging people will offer the best support with regard to feeding your  baby.* Educate yourself and choose the best feeding option for your baby. Make a list of people who will guide, support, and be a resource for you as your care for and feed your baby. (Friends that have breastfed or are currently breastfeeding, lactation consultants, breastfeeding support groups, etc.) Consider a postpartum doula. (These websites can give you information: dona.org & padanc.org) Seek out local breastfeeding resources like the breastfeeding support group at [facility] or [employer]. ______________________________________________________________________________________________________________________________________________________________________________________________________________________________________________________________________________________________________________________________________________________________________________________________________  CHORES AND ERRANDS Who can help with a thorough cleaning before baby is born? Make a list of people who will help with housekeeping and chores, like laundry, light cleaning, dishes, bathrooms, etc. Who can run some errands for you? What can you do to make sure you are stocked with basic supplies before baby is born? Who is going to do the shopping? ______________________________________________________________________________________________________________________________________________________________________________________________________________________________________________________________________________________________________________________________________________________________________________________________________     Family Adjustment *Nurture yourselves.it helps parents be more loving and allows for better bonding with their child.* What sorts of things do you and partner enjoy doing together? Which activities help you to connect and strengthen your relationship? Make a list of those   things. Make  a list of people whom you trust to care for your baby so you can have some time together as a couple. What types of things help partner feel connected to Mom? Make a list. What needs will partner have in order to bond with baby? Other children? Who will care for them when you go into labor and while you are in the hospital? Think about what the needs of your older children might be. Who can help you meet those needs? In what ways are you helping them prepare for bringing baby home? List some specific strategies you have for family adjustment. _______________________________________________________________________________________________________________________________________________________________________________________________________________________________________________________________________________________________________________________________________________  SUPPORT *Someone who can empathize with experiences normalizes your problems and makes them more bearable.* Make a list of other friends, neighbors, and/or co-workers you know with infants (and small children, if applicable) with whom you can connect. Make a list of local or online support groups, mom groups, etc. in which you can be involved. ______________________________________________________________________________________________________________________________________________________________________________________________________________________________________________________________________________________________________________________________________________________________________________________________________  Childcare Plans Investigate and plan for childcare if mom is returning to work. Talk about mom's concerns about her transition back to work. Talk about partner's concerns regarding this transition.  Mental Health *Your mental health is one of the highest priorities for a pregnant or postpartum mom.* 1 in  5 women experience anxiety and/or depression from the time of conception through the first year after birth. Postpartum Mood Disorders are the #1 complication of pregnancy and childbirth and the suffering experienced by these mothers is not necessary! These illnesses are temporary and respond well to treatment, which often includes self-care, social support, talk therapy, and medication when needed. Women experiencing anxiety and depression often say things like: "I'm supposed to be happy.why do I feel so sad?", "Why can't I snap out of it?", "I'm having thoughts that scare me." There is no need to be embarrassed if you are feeling these symptoms: Overwhelmed, anxious, angry, sad, guilty, irritable, hopeless, exhausted but can't sleep You are NOT alone. You are NOT to blame. With help, you WILL be well. Where can I find help? Medical professionals such as your OB, midwife, gynecologist, family practitioner, primary care provider, pediatrician, or mental health providers; [facility] support groups: Feelings After Birth, Breastfeeding Support Group, Baby and Me Group, and Fit 4 Two exercise classes. You have permission to ask for help. It will confirm your feelings, validate your experiences, share/learn coping strategies, and gain support and encouragement as you heal. You are important! BRAINSTORM Make a list of local resources, including resources for mom and for partner. Identify support groups. Identify people to call late at night - include names and contact info. Talk with partner about perinatal mood and anxiety disorders. Talk with your OB, midwife, and doula about baby blues and about perinatal mood and anxiety disorders. Talk with your pediatrician about perinatal mood and anxiety disorders.   Support & Sanity Savers   What do you really need?  Basics In preparing for a new baby, many expectant parents spend hours shopping for baby clothes, decorating the nursery, and deciding  which car seat to buy. Yet most don't think much about what the reality of parenting a newborn will be like, and what they need to make it through that. So, here is the advice of experienced parents. We know you'll read this, and think "they're exaggerating, I don't really need that." Just trust us on these, OK? Plan for all of this, and if it turns out you don't need it, come back and teach us how   you did it!  Must-Haves (Once baby's survival needs are met, make sure you attend to your own survival needs!) Sleep An average newborn sleeps 16-18 hours per day, over 6-7 sleep periods, rarely more than three hours at a time. It is normal and healthy for a newborn to wake throughout the night... but really hard on parents!! Naps. Prioritize sleep above any responsibilities like: cleaning house, visiting friends, running errands, etc.  Sleep whenever baby sleeps. If you can't nap, at least have restful times when baby eats. The more rest you get, the more patient you will be, the more emotionally stable, and better at solving problems.  Food You may not have realized it would be difficult to eat when you have a newborn. Yet, when we talk to countless new parents, they say things like "it may be 2:00 pm when I realize I haven't had breakfast yet." Or "every time we sit down to dinner, baby needs to eat, and my food gets cold, so I don't bother to eat it." Finger food. Before your baby is born, stock up with one months' worth of food that: 1) you can eat with one hand while holding a baby, 2) doesn't need to be prepped, 3) is good hot or cold, 4) doesn't spoil when left out for a few hours, and 5) you like to eat. Think about: nuts, dried fruit, Clif bars, pretzels, jerky, gogurt, baby carrots, apples, bananas, crackers, cheez-n-crackers, string cheese, hot pockets or frozen burritos to microwave, garden burgers and breakfast pastries to put in the toaster, yogurt drinks, etc. Restaurant Menus. Make lists of  your favorite restaurants & menu items. When family/friends want to help, you can give specific information without much thought. They can either bring you the food or send gift cards for just the right meals. Freezer Meals.  Take some time to make a few meals to put in the freezer ahead of time.  Easy to freeze meals can be anything such as soup, lasagna, chicken pie, or spaghetti sauce. Set up a Meal Schedule.  Ask friends and family to sign up to bring you meals during the first few weeks of being home. (It can be passed around at baby showers!) You have no idea how helpful this will be until you are in the throes of parenting.  www.takethemameal.com is a great website to check out. Emotional Support Know who to call when you're stressed out. Parenting a newborn is very challenging work. There are times when it totally overwhelms your normal coping abilities. EVERY NEW PARENT NEEDS TO HAVE A PLAN FOR WHO TO CALL WHEN THEY JUST CAN'T COPE ANY MORE. (And it has to be someone other than the baby's other parent!) Before your baby is born, come up with at least one person you can call for support - write their phone number down and post it on the refrigerator. Anxiety & Sadness. Baby blues are normal after pregnancy; however, there are more severe types of anxiety & sadness which can occur and should not be ignored.  They are always treatable, but you have to take the first step by reaching out for help. [facility] offers a "Mom Talk" group which meets every Tuesday from 10 am - 11 am.  This group is for new moms who need support and connection after their babies are born.  Call [phone].  Really, Really Helpful (Plan for them! Make sure these happen often!!) Physical Support with Taking Care of Yourselves Asking friends and family. Before   your baby is born, set up a schedule of people who can come and visit and help out (or ask a friend to schedule for you). Any time someone says "let me know what I  can do to help," sign them up for a day. When they get there, their job is not to take care of the baby (that's your job and your joy). Their job is to take care of you!  Postpartum doulas. If you don't have anyone you can call on for support, look into postpartum doulas:  professionals at helping parents with caring for baby, caring for themselves, getting breastfeeding started, and helping with household tasks. www.padanc.org is a helpful website for learning about doulas in our area. Peer Support / Parent Groups Why: One of the greatest ideas for new parents is to be around other new parents. Parent groups give you a chance to share and listen to others who are going through the same season of life, get a sense of what is normal infant development by watching several babies learn and grow, share your stories of triumph and struggles with empathetic ears, and forgive your own mistakes when you realize all parents are learning by trial and error. Where to find: There are many places you can meet other new parents throughout our community.  [facility] offers the following classes for new moms and their little ones:  Baby and Me (Birth to [place]) and Breastfeeding Support Group. Go to [facility] or call [phone] for more information. Time for your Relationship It's easy to get so caught up in meeting baby's immediate needs that it's hard to find time to connect with your partner, and meet the needs of your relationship. It's also easy to forget what "quality time with your partner" actually looks like. If you take your baby on a date, you'd be amazed how much of your couple time is spent feeding the baby, diapering the baby, admiring the baby, and talking about the baby. Dating: Try to take time for just the two of you. Babysitter tip: Sometimes when moms are breastfeeding a newborn, they find it hard to figure out how to schedule outings around baby's unpredictable feeding schedules.  Have the babysitter come for a three hour period. When she comes over, if baby has just eaten, you can leave right away, and come back in two hours. If baby hasn't fed recently, you start the date at home. Once baby gets hungry and gets a good feeding in, you can head out for the rest of your date time. Date Nights at Home: If you can't get out, at least set aside one evening a week to prioritize your relationship: whenever baby dozes off or doesn't have any immediate needs, spend a little time focusing on each other. Potential conflicts: The main relationship conflicts that come up for new parents are: issues related to sexuality, financial stresses, a feeling of an unfair division of household tasks, and conflicts in parenting styles. The more you can work on these issues before baby arrives, the better!  Fun and Frills (Don't forget these. and don't feel guilty for indulging in them!) Everyone has something in life that is a fun little treat that they do just for themselves. It may be: reading the morning paper, or going for a daily jog, or having coffee with a friend once a week, or going to a movie on Friday nights, or fine chocolates, or bubble baths, or curling up with a good book. Unless you do  fun things for yourself every now and then, it's hard to have the energy for fun with your baby. Whatever your "special" treats are, make sure you find a way to continue to indulge in them after your baby is born. These special moments can recharge you, and allow you to return to baby with a new joy   PERINATAL MOOD DISORDERS: [place]   _________________________________________Emergency and Crisis Resources If you are an imminent risk to self or others, are experiencing intense personal distress, and/or have noticed significant changes in activities of daily living, call:  [place]: [phone]  [address] [place], [place], [address] Mobile Crisis: [place]: 988 Or visit the following crisis centers: Local Emergency Departments [facility]: [address], [place]. Hours: 8:30AM-5PM. Insurance Accepted: Medicaid, Medicare, and Uninsured.  [facility]:  [address] [place]  Mon-Friday 8am-3pm, [phone]                                                                                  ___________ Non-Crisis Resources To identify specific providers that are covered by your insurance, contact your insurance company or local agencies:  [place]: [phone] [facility] [employer]: [place]: [phone] Postpartum Support International- Warm-line: [phone]                                                      __Outpatient Therapy and Medication Management   Providers:  [facility]: [phone] Hours: 9AM-5PM  Insurance Accepted: Alben Spittle, Shane Crutch, [place], [place] [facility]): [phone] Hours: 8AM-5:30PM  nsurance Accepted: All insurances EXCEPT AARP, [place], [place], and [place]: [phone] Hours: 8AM-8PM Insurance Accepted: Cristal Ford, Freddrick March, [place], Medicare, [facility] [phone] [facility]: [phone] Hours: 8:30AM-7PM Insurance Accepted: Cristal Ford, Medicaid, Medicare, Tricare, [employer] Counseling:  [place] Accepted:  Holland Falling, Lorella Nimrod, [employer], [place]: [phone] Hours: 9AM-5:30PM Insurance Accepted: Alben Spittle, Charlotte Crumb, and Medicaid, Medicare, [facility]:  [phone] Hours: 9am-5pm Insurance Accepted: BCBS; they do  not accept Medicaid/Medicare [facility]: [phone] Hours: 9am-9pm Insurance Accepted: All major insurance including Medicaid and Medicare [facility]: [phone] Hours: [place] Accepted: All insurances EXCEPT Medicaid and Medicare. [facility]: [phone]   ____________                                                                     Parenting Support Groups [facility]: [phone] [place]:  [place]  Support Network: (support for children in the NICU and/or with special needs), [phone]   ___________                                                                 Mental Health Support Groups Mental Health Association: [phone]    _____________                                                                                  Online Resources Postpartum Support International: http://www.postpartum.net/  800-944-4PPD 2Moms Supporting Moms:  www.momssupportingmoms.net     AREA PEDIATRIC/FAMILY PRACTICE PHYSICIANS  [place] ([address]) [facility] Chambliss, MD; Eniola, MD; Hale, MD; Hensel, MD; McDiarmid, MD; McIntyer, MD; Neal, MD; Walden, MD [address] [place], [place] [address] [phone] Mon-Fri 8:30-12:30, 1:30-5:00 Providers come to see babies at [facility] Accepting Medicaid [facility] providers who accept newborns: Koirala, MD; Morrow, MD; Wolters, MD [address], [place], [place] [address] [phone] Mon-Fri 8:00-5:30 Babies seen by providers at [facility] Does NOT accept Medicaid Please call early in hospitalization for appointment (limited availability)  [facility] Mulberry, MD [address] [place], [place] [address] [phone] Mon, Tue, Thur, Fri 8:30-5:00, Wed 10:00-7:00 (closed 1-2pm) Babies seen by [facility]  providers Accepting Medicaid Rub[place] - Pediatrician Rubin, MD [address], [place], [place] [address] [phone] Mon-Fri 8:30-5:00, Sat 8:30-12:00 Provider comes to see babies at [facility] Accepting Medicaid Must have been referred from current patients or contacted office prior to delivery [facility] ([facility]) Brown, MD; Chandler, MD; Ettefagh, MD; Grant, MD; Lester, MD; McCormick, MD; McQueen, MD; Prose, MD; Simha, MD; Stanley, MD; Stryffeler, NP; Tebben, NP [address], [place], [place] [address] [phone] Mon, Tue, Thur, Fri 8:30-5:30, Wed 9:30-5:30, Sat 8:30-12:30 Babies seen by [facility] providers Accepting Medicaid Only accepting infants of first-time parents or siblings of current patients Hospital discharge coordinator will make follow-up appointment Jack Amos [address], [place], [place]  [address] [phone]   Fax - [phone] [facility] [address], [place], [place]  [address] Phone - [phone]   Fax - [phone] Shilpa Gosrani [address], [place], [place]  [address] [phone]  [place] ([address])  [facility] Bates, MD; Brassfield, MD; Cooper, Cox, MD; MD; Davis, MD; Dovico, MD; Ettefaugh, MD; Little, MD; Lowe, MD; Keiffer, MD; Melvin, MD; Sumner, MD; Williams, MD [address], [place], [place] [address] [phone] Mon-Fri 8:30-5:00 (extended evenings Mon-Thur as needed), Sat-Sun 10:00-1:00 Providers come to see babies at [facility] Accepting Medicaid for families of first-time babies and families with all children in the household age 3 and under. Must register with office prior to making appointment (M-F only). [facility] Henson, NP; Knapp, MD; Lalonde, MD; Tysinger, PA [address] , [place] [address] [phone]   Mon-Fri 8:00-5:00 Babies seen by  providers at [facility] Does NOT accept Medicaid/Commercial Insurance Only [facility] ([facility])  Artis, MD; Barnes, MD; Bratton, MD; Coccaro, MD; Lockett Gardner, MD; Kramer, MD; Marshall, MD; Netherton, MD; Poleto, MD; Skinner, MD [address] [place], [place] [address] [phone] Mon-Fri 8:30-5:30, Sat (Oct.-Mar.) 9:00-1:00 Babies seen by providers at [facility] Accepting Medicaid  [place] ([address]) [facility] Reid, MD; Warner, MD [address], [place], [place] [address] [phone] Mon-Fri 8:30-5:00, Sat 8:30-12:00 Providers come to see babies at [facility] Does NOT accept Medicaid [facility] Becker, PA; Hagler, MD; Scifres, PA; Sun, MD; Swayne, MD [address], [place], [place] [address] [phone] Mon-Fri 8:00-5:00 Babies seen by providers at [facility] Does NOT accept Medicaid Only accepting babies of parents who are patients Please call early in hospitalization for appointment (limited availability) [facility] Clark, MD; Frye, MD; Kelleher, MD; Mack, NP; Miller, MD; O'Keller, MD; Patterson, NP; Pudlo, MD; Puzio, MD; Thomas, MD; Tucker, MD; Twiselton, MD [address], [place], [place] [address] [phone] Mon-Fri 8:00-5:00, Sat 9:00-12:00 Providers come to see babies at [facility] Does NOT accept Medicaid  [place] ([address]) [facility] [employer] providers accepting new patients: Brake, NP; Wharton, PA [address], [place], [place] [address] [phone] Mon-Fri 8:00-5:00 Babies seen by providers at [facility] Does NOT accept Medicaid Only accepting babies of parents who are patients Please call early in hospitalization for appointment (limited availability) [facility] Gay, MD; Quinlan, MD [address]  [place], [place] [address] [phone] (press 1 to schedule appointment) Mon-Fri 8:00-5:00 Providers come to see babies at [facility] Does NOT accept Medicaid [facility] Mazer, MD [address] [place], [place] [address] [phone] Mon-Fri 8:30-5:00 (lunch 12:30-1:00), extended hours by appointment only Wed 5:00-6:30 Babies seen by [facility] providers Accepting Medicaid [facility] Banks, MD; Jordan, MD; Koberlein, MD [address], [place], [place] [address] [phone] Mon-Fri 8:00-5:00 Babies seen by [facility] providers Does NOT accept Medicaid [facility] Parker, MD; Hunter, MD; Wallace, DO [address] [place], [place] [address] [phone] Mon-Fri 8:00-5:00 Babies seen by [facility] providers Does NOT accept Medicaid [facility] Brandon, PA; Brecken, PA; Christy, NP; Dees, MD; DeClaire, MD; DeWeese, MD; Hansen, NP; Mills, NP; Parrish, NP; Smoot, NP; Summer, MD; Vapne, MD [address] [place], [place] [address] [phone] Mon-Fri 8:30-5:00, Sat 10:00-1:00 Providers come to see babies at [facility] Does NOT accept Medicaid Free prenatal information session Tuesdays at 4:45pm [facility] Bouska, MD; Gordon, PA; Jeffery, PA; Weber, PA [address] [place] [address] [phone] Mon-Fri 7:30-5:30 Babies seen by [facility] providers [facility] [address], [place], [place]  [address] [phone]   Fax - [phone]  [place] ([address] & [address]) [facility] [place] [address] [place], [place] [address] [phone] Mon-Thur 8:00-6:00 Providers come to see babies at [facility] Accepting Medicaid [facility] Anderson, NP; Badger, MD; Beal, PA; Spencer, PA [address] [place], [place]  [address] [phone] Mon-Thur 7:30-7:30, Fri 7:30-4:30 Babies seen by [facility] providers Accepting Medicaid [facility] Agbuya, MD; Klett, NP; Romgoolam, MD [address], [place],  [address] [phone] Mon-Fri 8:30-5:00, Sat 8:30-12:00 Providers come to see babies at [facility] Accepting Medicaid   Must have "Meet & Greet" appointment at office prior to delivery [facility] McCord, MD; Wallace, MD; Wood, MD [address], [place], [place] [address] [phone] Mon-Wed 8:00-6:00, Thur-Fri 8:00-5:00, Sat 9:00-12:00 Providers come to see babies at [facility] Does NOT accept Medicaid Only accepting siblings of current patients [facility]  [address], [place], [place]  [address] [phone]   Fax - [phone] [facility] [address], [place], [place]  [address] [phone]   Fax - [phone]  [place] ([address] & [address]) [facility] Cirigliano, DO; Matthews, DO [address] [place], [place] [address] [phone] Mon-Fri 7:00-5:00 Babies seen by [facility] providers Does NOT accept Medicaid [facility] Briscoe, MD; Howley, PA; Moreira, PA [address], [place], [place] [address] [phone] Mon-Fri 8:00-5:00 Babies seen by [facility] providers Accepting Medicaid [facility] Boyd, MD; Church, PA; Jones, NP; Osborn, PA [address], [place], [place] [address] [phone] Mon-Fri 8:00-5:00 Babies seen by providers at [facility] Accepting Medicaid  [place] ([address]) [facility] [place] Wendling, DO [address] [place], [place] [address] [phone] Mon-Fri  8:00-5:00 Babies seen by [facility] providers Does NOT accept Medicaid Limited availability, please call early in hospitalization to schedule follow-up [facility] Calderon, PA; Cummings, MD; Dillard, MD; Martin, PA; Olson, MD; VanDeven, PA [address], [place], [place] [address] [phone] Mon-Fri 8:30-5:00, Sat 9:00-12:00 Babies seen by providers at [facility] Accepting Medicaid Please register online then schedule online or call office [email or web address]) Hunter, NP; Kumar, MD; Martin Rogers, PA [address], [place], [place] [address] [phone] Mon-Fri 8:00-5:00 Babies seen by providers at [facility] Accepting Medicaid [facility]) [place], MD; Kristi Fleenor, NP; West, MD [address], [place], [place] [address] [phone] Mon-Fri 8:00-5:30, Sat&Sun by appointment (phones open at 8:30) Babies seen by [facility] providers Accepting Medicaid Must be a first-time baby or sibling of current patient [facility] [place]  [address], [place], [place]  [address] [phone]   Fax - [phone]  [place] ([address]) [facility] Brown, PA; Cowen, PA; Rice, MD; Helton, PA; Spry, MD [address] [place], [place] [address] [phone] Mon-Thur 8:00-7:00, Fri 8:00-5:00, Sat 8:00-12:00, Sun 9:00-12:00 Babies seen by [facility] providers Accepting Medicaid [facility] [place] Coe-Goins, MD; Marshall, MD; Pierre-Louis, MD [address], [place], [place] [address] [phone] Mon-Thur 8:00-5:00 Babies seen by providers at [facility] Accepting Medicaid [facility] Bratton, MD; Coe-Goins, MD; Hayes, MD; Lewis, MD;  List, MD; Lott, MD; Marshall, MD; Moran, MD; O'Neal, MD; Pierre-Louis, MD; Pitonzo, MD; Scholer, MD; Spangle, MD [address] [place], [place] [address] [phone] Mon-Fri 8:00-5:30, Sat (Oct.-Mar.) 9:00-1:00 Babies seen by providers at [facility] Accepting Medicaid Must fill out new patient packet, available online at [email or web address] [facility] ([facility]) Friddle, NP; Harris, NP; Kelly, NP; Logan, MD; Melvin, PA; Poth, MD; Ramadoss, MD; Stanton, NP [address], [place], [place] [address] [phone] Mon-Thur   8:00-5:30, Fri 8:00-5:00 Babies seen by providers at [place] [phone]) [place], [place]; [place], MD; [place], MD; [place], [facility] [address] [place], [place] [address] [phone] Mon-Fri 8:00-5:00 Babies seen by providers at [place] [phone]) [place] [facility], [place]; [place], MD; Vanduser[address], [place], [place] [address] [phone] Mon-Fri 8:00-5:00 Babies seen by providers at [facility] Does NOT accept Medicaid Limited appointment availability, please call early in hospitalization  [place] [facility], [place]; [place], MD [address] [place], [place] [address] [phone] Mon-Fri 8:00-5:00 Babies seen by [facility] providers Does NOT accept Medicaid Graham, MD; Guy Sandifer, MD; [place], [place]; [place], MD [address], [place], [place] [address] [phone] Mon-Fri 8:00-5:00 After hours clinic [facility] [place], [place] [address]) [phone] Mon-Fri 5:00-8:00, Sat 12:00-6:00, Sun 10:00-4:00 Babies seen by [facility] providers Accepting Medicaid [place] at [facility] [address], [place], [place]   [address] [phone]   Fax - [phone]  [place] [phone]) [place] at [facility], [place] [address] [place], [place], [place] [address] [phone] Mon-Fri 8:00-5:00 Babies seen by [facility] providers Does NOT accept Medicaid [place] ([place] [facility] [place]) Daron Offer, [place] [address], [place], [place] [address] [phone] Mon-Thur 8:00-7:00, Fri 8:00-5:00, Sat 8:00-12:00 Babies seen by providers at [facility] Accepting Medicaid - but does not have vaccinations in office (must be received elsewhere) Limited availability, please call early in hospitalization  [place] [phone]) [place], MD [address], [place] [address] [phone]  Fax [phone]  [facility]  Lauretta Chester, MD, [place], [place], [place], [place], [place], [place] [address] [facility]  [place], [place], [place] [address] [place], [place], [place] [address] [phone] ([place])  [facility] [address], [place], [place] [address] [place], [place], [employer] [address] [phone] [facility] [address] [place], [place], [place] [address] [phone] [facility] [address] [place], [place] [address] [place], [place], [place] [address] [facility] [address], [place], [place] [address] [phone] [place]. [address] [place], [place] [address] [phone] Dr. Okey Regal. [place] [address], [place], [place] [address] [facility] [address], [place] [address], [place], [place]  [address] [facility] [address] [place], [place] [address] [phone]

## 2021-03-12 NOTE — Progress Notes (Signed)
    Subjective:  Alexandra Lyons is a 20 y.o. G1P0 at [redacted]w[redacted]d being seen today for ongoing prenatal care.  She is currently monitored for the following issues for this low-risk pregnancy and has Supervision of other normal pregnancy, antepartum and Seasonal affective disorder (HCC) on their problem list.  Patient reports no complaints.  Contractions: Irritability. Vag. Bleeding: None.  Movement: Present. Denies leaking of fluid.   The following portions of the patient's history were reviewed and updated as appropriate: allergies, current medications, past family history, past medical history, past social history, past surgical history and problem list. Problem list updated.  Objective:   Vitals:   03/12/21 0913  BP: 123/69  Pulse: 87  Weight: 208 lb 3.2 oz (94.4 kg)    Fetal Status: Fetal Heart Rate (bpm): 142   Movement: Present     General:  Alert, oriented and cooperative. Patient is in no acute distress.  Skin: Skin is warm and dry. No rash noted.   Cardiovascular: Normal heart rate noted  Respiratory: Normal respiratory effort, no problems with respiration noted  Abdomen: Soft, gravid, appropriate for gestational age. Pain/Pressure: Present     Pelvic:  Cervical exam deferred        Extremities: Normal range of motion.  Edema: None  Mental Status: Normal mood and affect. Normal behavior. Normal judgment and thought content.   Urinalysis:      Assessment and Plan:  Pregnancy: G1P0 at [redacted]w[redacted]d  1. Supervision of other normal pregnancy, antepartum Getting glucola today Got TDAP, will get flu next visit Missed one appt - has not been seen since Sept.  Reviewed visits every 2 weeks Reviewed signing up for childbirth and breastfeeding classes Info given - peds list and postpartum planning  - [facility] AMB BABYSCRIPTS SCHEDULE OPTIMIZATION - Tdap vaccine greater than or equal to 7yo IM - Culture, OB Urine  2. Seasonal affective disorder (HCC) Has had some problems with  depression in the past - crying spells in this pregnancy.  Will do anticipatory visit with BH.  Has taken medication before but is not on any now.  - Ambulatory referral to Integrated Behavioral Health  3. Positive urine culture Needs TOC  - Culture, OB Urine  4. [redacted] weeks gestation of pregnancy   Preterm labor symptoms and general obstetric precautions including but not limited to vaginal bleeding, contractions, leaking of fluid and fetal movement were reviewed in detail with the patient. Please refer to After Visit Summary for other counseling recommendations.  Return in about 2 weeks (around 03/26/2021) for in person ROB.  Nolene Bernheim, RN, MSN, NP-BC Nurse Practitioner, [facility] for [employer], [facility] 03/12/2021 12:38 PM

## 2021-03-13 LAB — CBC
Hematocrit: 30.5 % — ABNORMAL LOW (ref 34.0–46.6)
Hemoglobin: 10 g/dL — ABNORMAL LOW (ref 11.1–15.9)
MCH: 28.2 pg (ref 26.6–33.0)
MCHC: 32.8 g/dL (ref 31.5–35.7)
MCV: 86 fL (ref 79–97)
Platelets: 176 10*3/uL (ref 150–450)
RBC: 3.54 x10E6/uL — ABNORMAL LOW (ref 3.77–5.28)
RDW: 12.1 % (ref 11.7–15.4)
WBC: 6.1 10*3/uL (ref 3.4–10.8)

## 2021-03-13 LAB — HIV ANTIBODY (ROUTINE TESTING W REFLEX): HIV Screen 4th Generation wRfx: NONREACTIVE

## 2021-03-13 LAB — GLUCOSE TOLERANCE, 2 HOURS W/ 1HR
Glucose, 1 hour: 87 mg/dL (ref 70–179)
Glucose, 2 hour: 87 mg/dL (ref 70–152)
Glucose, Fasting: 74 mg/dL (ref 70–91)

## 2021-03-13 LAB — RPR: RPR Ser Ql: NONREACTIVE

## 2021-03-15 LAB — CULTURE, OB URINE

## 2021-03-15 LAB — URINE CULTURE, OB REFLEX

## 2021-03-15 NOTE — BH Specialist Note (Signed)
Pt did not arrive to video visit and did not answer the phone; Left HIPPA-compliant message to call back Luan Urbani from [facility] at  [phone] (Kamonte Mcmichen's office).  ?; left MyChart message for patient.  ? ?

## 2021-03-21 ENCOUNTER — Ambulatory Visit: Payer: BC Managed Care – PPO | Admitting: Clinical

## 2021-03-21 DIAGNOSIS — Z91199 Patient's noncompliance with other medical treatment and regimen due to unspecified reason: Secondary | ICD-10-CM

## 2021-03-22 ENCOUNTER — Ambulatory Visit (HOSPITAL_COMMUNITY): Admit: 2021-03-22 | Disposition: A | Discharge status: Home / Self Care | Payer: Medicaid Other

## 2021-03-26 ENCOUNTER — Ambulatory Visit (INDEPENDENT_AMBULATORY_CARE_PROVIDER_SITE_OTHER): Payer: Medicaid Other

## 2021-03-26 ENCOUNTER — Other Ambulatory Visit: Payer: Self-pay

## 2021-03-26 VITALS — BP 107/46 | HR 105 | Wt 209.6 lb

## 2021-03-26 DIAGNOSIS — Z23 Encounter for immunization: Secondary | ICD-10-CM

## 2021-03-26 DIAGNOSIS — Z3A31 31 weeks gestation of pregnancy: Secondary | ICD-10-CM

## 2021-03-26 DIAGNOSIS — Z3480 Encounter for supervision of other normal pregnancy, unspecified trimester: Secondary | ICD-10-CM

## 2021-03-26 NOTE — Progress Notes (Signed)
   PRENATAL VISIT NOTE  Subjective:  Alexandra Lyons is a 20 y.o. G1P0 at [redacted]w[redacted]d being seen today for ongoing prenatal care.  She is currently monitored for the following issues for this low-risk pregnancy and has Supervision of other normal pregnancy, antepartum and Seasonal affective disorder (HCC) on their problem list.  Patient reports no complaints.  Contractions: Not present. Vag. Bleeding: None.  Movement: Present. Denies leaking of fluid.   The following portions of the patient's history were reviewed and updated as appropriate: allergies, current medications, past family history, past medical history, past social history, past surgical history and problem list.   Objective:   Vitals:   03/26/21 0940 03/26/21 1106  BP: (!) 107/46   Pulse: (!) 140 (!) 105  Weight: 209 lb 9.6 oz (95.1 kg)     Fetal Status: Fetal Heart Rate (bpm): 135 Fundal Height: 31 cm Movement: Present     General:  Alert, oriented and cooperative. Patient is in no acute distress.  Skin: Skin is warm and dry. No rash noted.   Cardiovascular: Normal heart rate noted  Respiratory: Normal respiratory effort, no problems with respiration noted  Abdomen: Soft, gravid, appropriate for gestational age.  Pain/Pressure: Present     Pelvic: Cervical exam deferred        Extremities: Normal range of motion.  Edema: None  Mental Status: Normal mood and affect. Normal behavior. Normal judgment and thought content.   Assessment and Plan:  Pregnancy: G1P0 at [redacted]w[redacted]d 1. Supervision of other normal pregnancy, antepartum - Routine OB. Doing well. No concerns - Anticipatory guidance provided for upcoming appointments provided   - Flu Vaccine QUAD 66mo+IM (Fluarix, Fluzone & Alfiuria Quad PF)   Preterm labor symptoms and general obstetric precautions including but not limited to vaginal bleeding, contractions, leaking of fluid and fetal movement were reviewed in detail with the patient. Please refer to After Visit  Summary for other counseling recommendations.   Return in about 2 weeks (around 04/09/2021).  Future Appointments  Date Time Provider Department Center  04/09/2021 10:35 AM Berle Mull [facility]  04/23/2021  8:15 AM Allayne Stack, DO [facility]  05/02/2021 10:55 AM Adam Phenix, MD [facility]    Brand Males, CNM 03/26/21 11:59 AM

## 2021-04-09 ENCOUNTER — Other Ambulatory Visit: Payer: Self-pay

## 2021-04-09 ENCOUNTER — Ambulatory Visit (INDEPENDENT_AMBULATORY_CARE_PROVIDER_SITE_OTHER): Payer: Medicaid Other

## 2021-04-09 VITALS — BP 118/52 | HR 91 | Wt 215.1 lb

## 2021-04-09 DIAGNOSIS — Z3480 Encounter for supervision of other normal pregnancy, unspecified trimester: Secondary | ICD-10-CM

## 2021-04-09 DIAGNOSIS — O99019 Anemia complicating pregnancy, unspecified trimester: Secondary | ICD-10-CM

## 2021-04-09 DIAGNOSIS — Z3A33 33 weeks gestation of pregnancy: Secondary | ICD-10-CM

## 2021-04-09 MED ORDER — FERROUS SULFATE 325 (65 FE) MG PO TABS: 325.0000 mg | ORAL_TABLET | ORAL | 1 refills | Status: DC

## 2021-04-09 NOTE — Progress Notes (Signed)
Patient reports increase of appetite along with pain in pelvic floor. Patient denies any vaginal bleeding but stated she has occasional braxton hicks.  Alexandra Lyons, CMA

## 2021-04-09 NOTE — Progress Notes (Signed)
   PRENATAL VISIT NOTE  Subjective:  Alexandra Lyons is a 20 y.o. G1P0 at [redacted]w[redacted]d being seen today for ongoing prenatal care.  She is currently monitored for the following issues for this low-risk pregnancy and has Supervision of other normal pregnancy, antepartum and Seasonal affective disorder (HCC) on their problem list.  Patient reports no complaints.  Contractions: Not present. Vag. Bleeding: None.  Movement: Present. Denies leaking of fluid.   The following portions of the patient's history were reviewed and updated as appropriate: allergies, current medications, past family history, past medical history, past social history, past surgical history and problem list.   Objective:   Vitals:   04/09/21 1046  BP: (!) 118/52  Pulse: 91  Weight: 215 lb 1.6 oz (97.6 kg)    Fetal Status: Fetal Heart Rate (bpm): 129 Fundal Height: 33 cm Movement: Present     General:  Alert, oriented and cooperative. Patient is in no acute distress.  Skin: Skin is warm and dry. No rash noted.   Cardiovascular: Normal heart rate noted  Respiratory: Normal respiratory effort, no problems with respiration noted  Abdomen: Soft, gravid, appropriate for gestational age.  Pain/Pressure: Absent     Pelvic: Cervical exam deferred        Extremities: Normal range of motion.     Mental Status: Normal mood and affect. Normal behavior. Normal judgment and thought content.   Assessment and Plan:  Pregnancy: G1P0 at [redacted]w[redacted]d 1. Anemia affecting pregnancy, antepartum - Refill   - ferrous sulfate (FERROUSUL) 325 (65 FE) MG tablet; Take 1 tablet (325 mg total) by mouth every other day.  Dispense: 60 tablet; Refill: 1  2. Supervision of other normal pregnancy, antepartum - Routine OB care. Doing well, no concerns - Anticipatory guidance for upcoming appointment provided  3. [redacted] weeks gestation of pregnancy   Preterm labor symptoms and general obstetric precautions including but not limited to vaginal bleeding,  contractions, leaking of fluid and fetal movement were reviewed in detail with the patient. Please refer to After Visit Summary for other counseling recommendations.   Return in about 2 weeks (around 04/23/2021).  Future Appointments  Date Time Provider Department Center  04/23/2021  8:15 AM Allayne Stack, DO [facility]  05/02/2021 10:55 AM Adam Phenix, MD [facility]    Brand Males, CNM 04/09/21 3:02 PM

## 2021-04-14 ENCOUNTER — Inpatient Hospital Stay (HOSPITAL_COMMUNITY)
Admission: AD | Admit: 2021-04-14 | Discharge: 2021-04-14 | Disposition: A | Discharge status: Home / Self Care | Payer: Medicaid Other | Attending: Obstetrics and Gynecology | Admitting: Obstetrics and Gynecology

## 2021-04-14 ENCOUNTER — Other Ambulatory Visit: Payer: Self-pay

## 2021-04-14 ENCOUNTER — Encounter (HOSPITAL_COMMUNITY): Payer: Self-pay | Admitting: Obstetrics and Gynecology

## 2021-04-14 DIAGNOSIS — O98513 Other viral diseases complicating pregnancy, third trimester: Secondary | ICD-10-CM | POA: Insufficient documentation

## 2021-04-14 DIAGNOSIS — Z3A34 34 weeks gestation of pregnancy: Secondary | ICD-10-CM | POA: Insufficient documentation

## 2021-04-14 DIAGNOSIS — U071 COVID-19: Secondary | ICD-10-CM

## 2021-04-14 HISTORY — DX: COVID-19: U07.1

## 2021-04-14 LAB — URINALYSIS, ROUTINE W REFLEX MICROSCOPIC
Bilirubin Urine: NEGATIVE
Glucose, UA: NEGATIVE mg/dL
Hgb urine dipstick: NEGATIVE
Ketones, ur: 80 mg/dL — AB
Leukocytes,Ua: NEGATIVE
Nitrite: NEGATIVE
Protein, ur: NEGATIVE mg/dL
Specific Gravity, Urine: 1.019 (ref 1.005–1.030)
pH: 6 (ref 5.0–8.0)

## 2021-04-14 MED ORDER — LACTATED RINGERS IV BOLUS
1000.0000 mL | Freq: Once | INTRAVENOUS | Status: AC
  Administered 2021-04-14: 1000 mL via INTRAVENOUS

## 2021-04-14 NOTE — MAU Provider Note (Signed)
History     CSN: [id number]  Arrival date and time: 04/14/21 1422   Event Date/Time   First Provider Initiated Contact with Patient 04/14/21 1651      Chief Complaint  Patient presents with   Covid Positive   Contractions   HPI Alexandra Lyons is a 20 y.o. G1P0 at [redacted]w[redacted]d who presents with COVID symptoms & "braxton hicks contractions".  Started feeling bad yesterday. Reports nasal congestion, cough, bodyaches, and chills. Took a home covid test today that was positive. Rates body pain 7/10 & headache 5/10. Hasn't treated symptoms. Nothing makes worse. Denies shortness of breath.  Reports abdominal tightening today that occurs about once per hour. Denies n/v/d, dysuria, vaginal bleeding, or LOF. Reports good fetal movement.   OB History     Gravida  1   Para      Term      Preterm      AB      Living         SAB      IAB      Ectopic      Multiple      Live Births              Past Medical History:  Diagnosis Date   Anemia     Past Surgical History:  Procedure Laterality Date   NO PAST SURGERIES      Family History  Problem Relation Age of Onset   Diabetes Paternal Grandmother    Brain cancer Other     Social History   Tobacco Use   Smoking status: Former    Packs/day: 0.25    Years: 1.00    Pack years: 0.25    Types: Cigarettes    Quit date: 09/01/2020    Years since quitting: 0.6   Smokeless tobacco: Never  Vaping Use   Vaping Use: Former  Substance Use Topics   Alcohol use: Not Currently   Drug use: No    Allergies: No Known Allergies  No medications prior to admission.    Review of Systems  Constitutional:  Positive for chills and fever.  HENT:  Positive for congestion. Negative for sore throat.   Respiratory:  Positive for cough. Negative for shortness of breath and wheezing.   Cardiovascular:  Negative for chest pain.  Gastrointestinal:  Positive for abdominal pain. Negative for diarrhea, nausea and vomiting.   Genitourinary: Negative.   Musculoskeletal:  Positive for myalgias.  Neurological:  Positive for headaches.  Physical Exam   Blood pressure (!) 108/37, pulse (!) 108, temperature 98.8 F (37.1 C), temperature source Oral, resp. rate 18, height 5\' 11"  (1.803 m), weight 98.4 kg, last menstrual period 08/19/2020, SpO2 100 %.  Physical Exam Vitals and nursing note reviewed. Exam conducted with a chaperone present.  Constitutional:      General: She is not in acute distress.    Appearance: Normal appearance. She is ill-appearing. She is not toxic-appearing.  HENT:     Head: Normocephalic and atraumatic.  Eyes:     General: No scleral icterus.    Conjunctiva/sclera: Conjunctivae normal.  Cardiovascular:     Rate and Rhythm: Tachycardia present.     Heart sounds: Normal heart sounds.  Pulmonary:     Effort: Pulmonary effort is normal. No respiratory distress.     Breath sounds: Normal breath sounds. No wheezing.  Genitourinary:    Comments: Dilation: Closed Effacement (%): Thick Station: Ballotable Exam by:: Kyron Schlitt np  Musculoskeletal:  Cervical back: Normal range of motion and neck supple. No rigidity.  Lymphadenopathy:     Cervical: No cervical adenopathy.  Skin:    General: Skin is warm and dry.  Neurological:     General: No focal deficit present.     Mental Status: She is alert.  Psychiatric:        Mood and Affect: Mood normal.        Behavior: Behavior normal.   NST:  Baseline: 155 bpm, Variability: Good {> 6 bpm), Accelerations: Reactive, and Decelerations: Absent  MAU Course  Procedures Results for orders placed or performed during the hospital encounter of 04/14/21 (from the past 24 hour(s))  Urinalysis, Routine w reflex microscopic Urine, Clean Catch     Status: Abnormal   Collection Time: 04/14/21  4:49 PM  Result Value Ref Range   Color, Urine YELLOW YELLOW   APPearance HAZY (A) CLEAR   Specific Gravity, Urine 1.019 1.005 - 1.030   pH 6.0 5.0 -  8.0   Glucose, UA NEGATIVE NEGATIVE mg/dL   Hgb urine dipstick NEGATIVE NEGATIVE   Bilirubin Urine NEGATIVE NEGATIVE   Ketones, ur 80 (A) NEGATIVE mg/dL   Protein, ur NEGATIVE NEGATIVE mg/dL   Nitrite NEGATIVE NEGATIVE   Leukocytes,Ua NEGATIVE NEGATIVE    MDM Patient presents with COVID symptoms, had positive test at home. Given IV fluids in MAU. Took tylenol cold & sinus en route to the hospital. Vitals improved with fluids. Patient reports improvement in symptoms.   No regular ctx on monitor. Cervix closed/thick.   Assessment and Plan   1. COVID-19 affecting pregnancy in third trimester   2. [redacted] weeks gestation of pregnancy    -discussed symptomatic tx & given list of OTC meds safe in pregnancy -reviewed reasons to return to MAU -quarantine  Judeth Horn 04/14/2021, 8:22 PM

## 2021-04-14 NOTE — MAU Note (Signed)
Friday wasn't feeling well. Started as a cold (cough, runny nose, no fever) last night started having body aches, headache. Home covid teste was + a couple hrs ago. Also been having Braxton Hicks contractions, which she wasn't having before.

## 2021-04-14 NOTE — Discharge Instructions (Signed)

## 2021-04-23 ENCOUNTER — Ambulatory Visit (INDEPENDENT_AMBULATORY_CARE_PROVIDER_SITE_OTHER): Payer: Medicaid Other | Admitting: Family Medicine

## 2021-04-23 ENCOUNTER — Encounter: Payer: Self-pay | Admitting: Family Medicine

## 2021-04-23 ENCOUNTER — Other Ambulatory Visit: Payer: Self-pay

## 2021-04-23 VITALS — BP 108/63 | HR 103 | Wt 208.0 lb

## 2021-04-23 DIAGNOSIS — D563 Thalassemia minor: Secondary | ICD-10-CM

## 2021-04-23 DIAGNOSIS — O98513 Other viral diseases complicating pregnancy, third trimester: Secondary | ICD-10-CM

## 2021-04-23 DIAGNOSIS — Z3480 Encounter for supervision of other normal pregnancy, unspecified trimester: Secondary | ICD-10-CM

## 2021-04-23 DIAGNOSIS — U071 COVID-19: Secondary | ICD-10-CM

## 2021-04-23 DIAGNOSIS — Z3A35 35 weeks gestation of pregnancy: Secondary | ICD-10-CM

## 2021-04-23 DIAGNOSIS — F338 Other recurrent depressive disorders: Secondary | ICD-10-CM

## 2021-04-23 NOTE — Progress Notes (Signed)
Patient tested positive for Covid 04/14/21. She stated that her symptoms have subsided but still has "cough and congestion"   Rj Pedrosa, CMA   04/23/21

## 2021-04-23 NOTE — Progress Notes (Signed)
° ° °  Subjective:  Alexandra Lyons is a 20 y.o. G1P0 at [redacted]w[redacted]d being seen today for ongoing prenatal care.  She is currently monitored for the following issues for this low-risk pregnancy and has Supervision of other normal pregnancy, antepartum and Seasonal affective disorder ([place]) on their problem list.  Patient reports she is doing overall well, just some residual congestion from covid on 12/19. Using tylenol for relief. She is very worried that baby "hasn't flipped yet" and wants to check. Contractions: Not present. Vag. Bleeding: None.  Movement: Present. Denies leaking of fluid.   The following portions of the patient's history were reviewed and updated as appropriate: allergies, current medications, past family history, past medical history, past social history, past surgical history and problem list.   Objective:   Vitals:   04/23/21 0839  BP: 108/63  Pulse: (!) 103  Weight: 208 lb (94.3 kg)    Fetal Status: Fetal Heart Rate (bpm): 138 Fundal Height: 35 cm Movement: Present  Presentation: Vertex (leopolds and BSUS)   General:  Alert, oriented and cooperative. Patient is in no acute distress.  Skin: Skin is warm and dry. No rash noted.   Cardiovascular: Normal heart rate noted  Respiratory: Normal respiratory effort, no problems with respiration noted  Abdomen: Soft, gravid, appropriate for gestational age. Pain/Pressure: Absent     Pelvic:  Cervical exam deferred        Extremities: Normal range of motion.     Mental Status: Normal mood and affect. Normal behavior. Normal judgment and thought content.    Assessment and Plan:  Pregnancy: G1P0 at [redacted]w[redacted]d  1. Supervision of other normal pregnancy, antepartum Doing well with normal fetal movement. Confirmed vertex with BSUS and leopolds for patient reassurance.   2. [redacted] weeks gestation of pregnancy Plan for GBS, gc/ch next week.   3. Seasonal affective disorder ([place]) Doing well.   4. Alpha thalassemia silent  carrier Recommended FOB testing, she is going to discuss doing the at home kit with FOB.   5. COVID-19 affecting pregnancy in third trimester Much improved, residual congestion. Cont tylenol PRN.   Preterm labor symptoms and general obstetric precautions including but not limited to vaginal bleeding, contractions, leaking of fluid and fetal movement were reviewed in detail with the patient. Please refer to After Visit Summary for other counseling recommendations.   Return in about 1 week (around 04/30/2021) for LROB with GBS.   Patriciaann Clan, DO

## 2021-05-02 ENCOUNTER — Other Ambulatory Visit (HOSPITAL_COMMUNITY)
Admission: RE | Admit: 2021-05-02 | Discharge: 2021-05-02 | Disposition: A | Discharge status: Home / Self Care | Payer: Medicaid Other | Source: Ambulatory Visit | Attending: Obstetrics & Gynecology | Admitting: Obstetrics & Gynecology

## 2021-05-02 ENCOUNTER — Other Ambulatory Visit: Payer: Self-pay

## 2021-05-02 ENCOUNTER — Encounter: Payer: Medicaid Other | Admitting: Obstetrics & Gynecology

## 2021-05-02 ENCOUNTER — Ambulatory Visit (INDEPENDENT_AMBULATORY_CARE_PROVIDER_SITE_OTHER): Payer: Medicaid Other | Admitting: Certified Nurse Midwife

## 2021-05-02 VITALS — BP 111/67 | HR 89 | Wt 213.2 lb

## 2021-05-02 DIAGNOSIS — Z3A36 36 weeks gestation of pregnancy: Secondary | ICD-10-CM

## 2021-05-02 DIAGNOSIS — Z3490 Encounter for supervision of normal pregnancy, unspecified, unspecified trimester: Secondary | ICD-10-CM | POA: Diagnosis not present

## 2021-05-02 DIAGNOSIS — Z3493 Encounter for supervision of normal pregnancy, unspecified, third trimester: Secondary | ICD-10-CM

## 2021-05-02 LAB — OB RESULTS CONSOLE GC/CHLAMYDIA: Gonorrhea: NEGATIVE

## 2021-05-02 NOTE — Progress Notes (Signed)
° °  PRENATAL VISIT NOTE  Subjective:  Alexandra Lyons is a 20 y.o. G1P0 at [redacted]w[redacted]d being seen today for ongoing prenatal care.  She is currently monitored for the following issues for this low-risk pregnancy and has Supervision of low-risk first pregnancy, third trimester and Seasonal affective disorder (HCC) on their problem list.  Patient reports no complaints.  Contractions: Not present. Vag. Bleeding: None.  Movement: Present. Denies leaking of fluid.   The following portions of the patient's history were reviewed and updated as appropriate: allergies, current medications, past family history, past medical history, past social history, past surgical history and problem list.   Objective:   Vitals:   05/02/21 1142  BP: 111/67  Pulse: 89  Weight: 213 lb 3.2 oz (96.7 kg)    Fetal Status: Fetal Heart Rate (bpm): 138 Fundal Height: 36 cm Movement: Present  Presentation: Vertex  General:  Alert, oriented and cooperative. Patient is in no acute distress.  Skin: Skin is warm and dry. No rash noted.   Cardiovascular: Normal heart rate noted  Respiratory: Normal respiratory effort, no problems with respiration noted  Abdomen: Soft, gravid, appropriate for gestational age.  Pain/Pressure: Absent     Pelvic: Cervical exam deferred        Extremities: Normal range of motion.  Edema: Trace  Mental Status: Normal mood and affect. Normal behavior. Normal judgment and thought content.   Assessment and Plan:  Pregnancy: G1P0 at [redacted]w[redacted]d 1. Supervision of low-risk pregnancy, third trimester - Doing well, feeling regular and vigorous fetal movement   2. [redacted] weeks gestation of pregnancy - Routine OB care  - GC/Chlamydia probe amp ([place])not at [facility] - Culture, beta strep (group b only  Preterm labor symptoms and general obstetric precautions including but not limited to vaginal bleeding, contractions, leaking of fluid and fetal movement were reviewed in detail with the patient. Please  refer to After Visit Summary for other counseling recommendations.   Return in about 1 week (around 05/09/2021) for IN-PERSON, LOB.  Future Appointments  Date Time Provider Department Center  05/09/2021 10:15 AM Bernerd Limbo, CNM [facility]    Bernerd Limbo, CNM

## 2021-05-03 LAB — GC/CHLAMYDIA PROBE AMP (~~LOC~~) NOT AT ARMC
Chlamydia: NEGATIVE
Comment: NEGATIVE
Comment: NORMAL
Neisseria Gonorrhea: NEGATIVE

## 2021-05-06 LAB — CULTURE, BETA STREP (GROUP B ONLY): Strep Gp B Culture: NEGATIVE

## 2021-05-06 NOTE — L&D Delivery Note (Addendum)
OB/GYN Faculty Practice Delivery Note  Alexandra Lyons is a 21 y.o. G1P0 s/p SVD at [redacted]w[redacted]d. She was admitted for IOL due to non-reactive NST and BPP 6/10.   ROM: 8h 78m with clear fluid GBS Status: negative Maximum Maternal Temperature: 98.9  Labor Progress: Pt present for IOL, she is s/p cytotec x 3 doses, s/p FB and progressed to 6 cm.  Labor augmented with pitocin and pt progressed to completed.   Delivery Date/Time: 06/01/2021 @ 1737 Delivery: Called to room and patient was complete and pushing. Dr. Donavan Foil at the bedside to evaluated for possible Vacuum extraction due to repetitive variables. Head delivered LOA over intact perineum. No nuchal cord present. Shoulder and body delivered in usual fashion. Infant with slow cry, placed on mother's abdomen, dried and stimulated. Cord clamped x 2 after 1-minute delay, and cut by FOB-Jarrett. Cord gases and cord blood drawn. Placenta delivered spontaneously with gentle cord traction. Fundus firm with massage and Pitocin. Labia, perineum, vagina, and cervix inspected inspected with bilateral periurethral and bilateral sulcus. During repair, BRB trickle, 1000 mg TXA administered.  Upon reassessment fundus firm and bleeding stopped.   Placenta: delivered with leading membranes, intact, 3 VC, sent home with family  Complications: None Lacerations: bilateral periurethral (right laceration was repaired. Left sided laceration was small and hemostatic with 3-0 vicryl)  bilateral sulcus (repaired bilaterally with 4-0 vicryl) EBL: 275 cc Analgesia: Epidural  Postpartum Planning -Reassess contraception: pt remains undecided -Transfer to [facility] with routine orders  Infant: weight pending   APGARs 7/9  Alexandra Lyons   I attest that I was gowned and gloved for the delivery of the infant. Infant initially placed on mom's abdomen and stimulated however, at 1 minute APGARS were 7 so was brought to warmer and attended to by RNs.  Patient had uncomplicated 3rd  stage; during repair she had oozing from lacerations and so 1000 mg TXA given IV. Patient had total EBL of 275.  Dr. Donavan Foil was present at delivery due to NRFHT and possible need for vacuum  but patient delivered without assistance.   [name]

## 2021-05-09 ENCOUNTER — Other Ambulatory Visit: Payer: Self-pay

## 2021-05-09 ENCOUNTER — Ambulatory Visit (INDEPENDENT_AMBULATORY_CARE_PROVIDER_SITE_OTHER): Payer: Medicaid Other | Admitting: Certified Nurse Midwife

## 2021-05-09 VITALS — BP 112/62 | HR 79 | Wt 216.3 lb

## 2021-05-09 DIAGNOSIS — Z3493 Encounter for supervision of normal pregnancy, unspecified, third trimester: Secondary | ICD-10-CM

## 2021-05-09 DIAGNOSIS — F5104 Psychophysiologic insomnia: Secondary | ICD-10-CM

## 2021-05-09 DIAGNOSIS — Z3A37 37 weeks gestation of pregnancy: Secondary | ICD-10-CM

## 2021-05-09 MED ORDER — MAGNESIUM OXIDE -MG SUPPLEMENT 200 MG PO TABS: 400.0000 mg | ORAL_TABLET | Freq: Every day | ORAL | 3 refills | Status: DC

## 2021-05-11 NOTE — Progress Notes (Signed)
° °  PRENATAL VISIT NOTE  Subjective:  Alexandra Lyons is a 21 y.o. G1P0 at [redacted]w[redacted]d being seen today for ongoing prenatal care.  She is currently monitored for the following issues for this low-risk pregnancy and has Supervision of low-risk first pregnancy, third trimester and Seasonal affective disorder (HCC) on their problem list.  Patient reports  having trouble sleeping due to general late pregnancy discomfort .  Contractions: Not present. Vag. Bleeding: None.  Movement: Present. Denies leaking of fluid.   The following portions of the patient's history were reviewed and updated as appropriate: allergies, current medications, past family history, past medical history, past social history, past surgical history and problem list.   Objective:   Vitals:   05/09/21 1119  BP: 112/62  Pulse: 79  Weight: 216 lb 4.8 oz (98.1 kg)   Fetal Status: Fetal Heart Rate (bpm): 136 Fundal Height: 37 cm Movement: Present  Presentation: Vertex  General:  Alert, oriented and cooperative. Patient is in no acute distress.  Skin: Skin is warm and dry. No rash noted.   Cardiovascular: Normal heart rate noted  Respiratory: Normal respiratory effort, no problems with respiration noted  Abdomen: Soft, gravid, appropriate for gestational age.  Pain/Pressure: Absent     Pelvic: Cervical exam deferred        Extremities: Normal range of motion.  Edema: None  Mental Status: Normal mood and affect. Normal behavior. Normal judgment and thought content.   Assessment and Plan:  Pregnancy: G1P0 at [redacted]w[redacted]d 1. Supervision of low-risk pregnancy, third trimester - Doing well, feeling regular and vigorous fetal movement   2. [redacted] weeks gestation of pregnancy - Routine OB care including reviewing GBS results (negative)  3. Psychophysiological insomnia - Magnesium Oxide (MAG-OXIDE) 200 MG TABS; Take 2 tablets (400 mg total) by mouth at bedtime. If that amount causes loose stools in the am, switch to 200mg  daily at  bedtime.  Dispense: 60 tablet; Refill: 3  Term labor symptoms and general obstetric precautions including but not limited to vaginal bleeding, contractions, leaking of fluid and fetal movement were reviewed in detail with the patient. Please refer to After Visit Summary for other counseling recommendations.   Return in about 1 week (around 05/16/2021) for IN-PERSON, LOB.  Future Appointments  Date Time Provider Department Center  05/17/2021  3:55 PM 07/15/2021, MD [facility]  05/24/2021 10:55 AM 05/26/2021, MD [facility]  05/31/2021 11:15 AM 06/02/2021, CNM [facility]    [facility] CNM

## 2021-05-17 ENCOUNTER — Encounter: Payer: Self-pay | Admitting: Obstetrics and Gynecology

## 2021-05-17 ENCOUNTER — Ambulatory Visit (INDEPENDENT_AMBULATORY_CARE_PROVIDER_SITE_OTHER): Payer: Medicaid Other | Admitting: Obstetrics and Gynecology

## 2021-05-17 ENCOUNTER — Other Ambulatory Visit: Payer: Self-pay

## 2021-05-17 VITALS — BP 122/63 | HR 80 | Wt 217.0 lb

## 2021-05-17 DIAGNOSIS — Z3A38 38 weeks gestation of pregnancy: Secondary | ICD-10-CM

## 2021-05-17 DIAGNOSIS — Z3480 Encounter for supervision of other normal pregnancy, unspecified trimester: Secondary | ICD-10-CM

## 2021-05-17 NOTE — Progress Notes (Signed)
Patient reports "losing mucus plug last Friday. She mentioned having irregular contractions. 1 single contraction would last for about 3 minutes and then go away.   She denies any vaginal bleeding and stated that baby is still moving daily

## 2021-05-17 NOTE — Progress Notes (Signed)
° °  PRENATAL VISIT NOTE  Subjective:  Alexandra Lyons is a 21 y.o. G1P0 at [redacted]w[redacted]d being seen today for ongoing prenatal care.  She is currently monitored for the following issues for this low-risk pregnancy and has Supervision of low-risk first pregnancy, third trimester on their problem list.  Patient reports no complaints.  Contractions: Irregular. Vag. Bleeding: None.  Movement: Present. Denies leaking of fluid.   The following portions of the patient's history were reviewed and updated as appropriate: allergies, current medications, past family history, past medical history, past social history, past surgical history and problem list.   Objective:   Vitals:   05/17/21 1622  BP: 122/63  Pulse: 80  Weight: 217 lb (98.4 kg)    Fetal Status: Fetal Heart Rate (bpm): 144 Fundal Height: 38 cm Movement: Present  Presentation: Vertex  General:  Alert, oriented and cooperative. Patient is in no acute distress.  Skin: Skin is warm and dry. No rash noted.   Cardiovascular: Normal heart rate noted  Respiratory: Normal respiratory effort, no problems with respiration noted  Abdomen: Soft, gravid, appropriate for gestational age.  Pain/Pressure: Present     Pelvic: Cervical exam performed in the presence of a chaperone Dilation: Fingertip Effacement (%): 20 Station: Ballotable  Extremities: Normal range of motion.     Mental Status: Normal mood and affect. Normal behavior. Normal judgment and thought content.   Assessment and Plan:  Pregnancy: G1P0 at [redacted]w[redacted]d 1. [redacted] weeks gestation of pregnancy Routine care. GBS neg. Set up post dates IOL nv  Term labor symptoms and general obstetric precautions including but not limited to vaginal bleeding, contractions, leaking of fluid and fetal movement were reviewed in detail with the patient. Please refer to After Visit Summary for other counseling recommendations.   Return in about 1 week (around 05/24/2021) for md or app, in person, low risk  ob.  Future Appointments  Date Time Provider Department Center  05/24/2021 10:55 AM Donnamae Jude, MD [facility]  05/31/2021 11:15 AM Starr Lake, CNM [facility]    Aletha Halim, MD

## 2021-05-24 ENCOUNTER — Ambulatory Visit (INDEPENDENT_AMBULATORY_CARE_PROVIDER_SITE_OTHER): Payer: Medicaid Other | Admitting: Family Medicine

## 2021-05-24 ENCOUNTER — Other Ambulatory Visit: Payer: Self-pay

## 2021-05-24 VITALS — BP 110/78 | HR 88 | Wt 219.6 lb

## 2021-05-24 DIAGNOSIS — Z3403 Encounter for supervision of normal first pregnancy, third trimester: Secondary | ICD-10-CM

## 2021-05-24 NOTE — Progress Notes (Signed)
° °  PRENATAL VISIT NOTE  Subjective:  Alexandra Lyons is a 21 y.o. G1P0 at [redacted]w[redacted]d being seen today for ongoing prenatal care.  She is currently monitored for the following issues for this low-risk pregnancy and has Supervision of low-risk first pregnancy, third trimester on their problem list.  Patient reports no complaints.  Contractions: Irregular. Vag. Bleeding: None.  Movement: Present. Denies leaking of fluid.   The following portions of the patient's history were reviewed and updated as appropriate: allergies, current medications, past family history, past medical history, past social history, past surgical history and problem list.   Objective:   Vitals:   05/24/21 1128 05/24/21 1132  BP: 110/78   Pulse: (!) 136 88  Weight: 219 lb 9.6 oz (99.6 kg)     Fetal Status: Fetal Heart Rate (bpm): 135 Fundal Height: 37 cm Movement: Present  Presentation: Vertex  General:  Alert, oriented and cooperative. Patient is in no acute distress.  Skin: Skin is warm and dry. No rash noted.   Cardiovascular: Normal heart rate noted  Respiratory: Normal respiratory effort, no problems with respiration noted  Abdomen: Soft, gravid, appropriate for gestational age.  Pain/Pressure: Present     Pelvic: Cervical exam deferred        Extremities: Normal range of motion.  Edema: Trace  Mental Status: Normal mood and affect. Normal behavior. Normal judgment and thought content.   Assessment and Plan:  Pregnancy: G1P0 at [redacted]w[redacted]d 1. Supervision of low-risk first pregnancy, third trimester Continue routine prenatal care. IOL scheduled at 41 weeks Orders placed  Term labor symptoms and general obstetric precautions including but not limited to vaginal bleeding, contractions, leaking of fluid and fetal movement were reviewed in detail with the patient. Please refer to After Visit Summary for other counseling recommendations.   Return in 1 week (on 05/31/2021) for OB visit and BPP.  Future Appointments   Date Time Provider Department Center  05/31/2021 11:15 AM Madlyn Frankel [facility]  06/02/2021  6:30 AM [facility] SCHED ROOM [facility] None    Reva Bores, MD

## 2021-05-25 ENCOUNTER — Telehealth (HOSPITAL_COMMUNITY): Payer: Self-pay | Admitting: *Deleted

## 2021-05-25 ENCOUNTER — Encounter: Payer: Self-pay | Admitting: *Deleted

## 2021-05-25 NOTE — Telephone Encounter (Signed)
Preadmission screen  

## 2021-05-28 ENCOUNTER — Telehealth (HOSPITAL_COMMUNITY): Payer: Self-pay | Admitting: *Deleted

## 2021-05-28 ENCOUNTER — Encounter (HOSPITAL_COMMUNITY): Payer: Self-pay | Admitting: *Deleted

## 2021-05-28 NOTE — Telephone Encounter (Signed)
Preadmission screen  

## 2021-05-30 ENCOUNTER — Telehealth: Payer: Self-pay

## 2021-05-30 ENCOUNTER — Other Ambulatory Visit: Payer: Self-pay | Admitting: Advanced Practice Midwife

## 2021-05-30 NOTE — Telephone Encounter (Addendum)
-----   Message from Marylene Land, CNM sent at 05/30/2021  6:47 AM EST ----- Regarding: patient needs NST Hi! This patient is coming in for her visit tomorrow; she will be 40 weeks and 5 days tomorrow. Is it possible for her to have at least an NST today or tomorrow for post dates, even if we can't do the full AFI or BPP.   Thank you!  [name]  --------------------------------  Called patient to follow up. Pt agreeable to appt at 8:15 for NST/BPP.

## 2021-05-31 ENCOUNTER — Encounter: Payer: Self-pay | Admitting: Student

## 2021-05-31 ENCOUNTER — Inpatient Hospital Stay (HOSPITAL_COMMUNITY)
Admission: AD | Admit: 2021-05-31 | Discharge: 2021-06-03 | DRG: 807 | Disposition: A | Discharge status: Home / Self Care | Payer: Medicaid Other | Attending: Obstetrics and Gynecology | Admitting: Obstetrics and Gynecology

## 2021-05-31 ENCOUNTER — Other Ambulatory Visit: Payer: Self-pay

## 2021-05-31 ENCOUNTER — Ambulatory Visit (INDEPENDENT_AMBULATORY_CARE_PROVIDER_SITE_OTHER): Payer: Medicaid Other | Admitting: General Practice

## 2021-05-31 ENCOUNTER — Ambulatory Visit (INDEPENDENT_AMBULATORY_CARE_PROVIDER_SITE_OTHER): Payer: Medicaid Other

## 2021-05-31 ENCOUNTER — Encounter (HOSPITAL_COMMUNITY): Payer: Self-pay | Admitting: Obstetrics and Gynecology

## 2021-05-31 ENCOUNTER — Ambulatory Visit (INDEPENDENT_AMBULATORY_CARE_PROVIDER_SITE_OTHER): Payer: Medicaid Other | Admitting: Student

## 2021-05-31 VITALS — BP 126/69 | HR 83 | Wt 223.0 lb

## 2021-05-31 DIAGNOSIS — Z8616 Personal history of COVID-19: Secondary | ICD-10-CM

## 2021-05-31 DIAGNOSIS — O480 Post-term pregnancy: Secondary | ICD-10-CM

## 2021-05-31 DIAGNOSIS — Z3403 Encounter for supervision of normal first pregnancy, third trimester: Secondary | ICD-10-CM | POA: Diagnosis not present

## 2021-05-31 DIAGNOSIS — Z87891 Personal history of nicotine dependence: Secondary | ICD-10-CM | POA: Diagnosis not present

## 2021-05-31 DIAGNOSIS — Z3A40 40 weeks gestation of pregnancy: Secondary | ICD-10-CM

## 2021-05-31 LAB — CBC
HCT: 28.9 % — ABNORMAL LOW (ref 36.0–46.0)
Hemoglobin: 9.4 g/dL — ABNORMAL LOW (ref 12.0–15.0)
MCH: 28.9 pg (ref 26.0–34.0)
MCHC: 32.5 g/dL (ref 30.0–36.0)
MCV: 88.9 fL (ref 80.0–100.0)
Platelets: 199 10*3/uL (ref 150–400)
RBC: 3.25 MIL/uL — ABNORMAL LOW (ref 3.87–5.11)
RDW: 15.2 % (ref 11.5–15.5)
WBC: 6.3 10*3/uL (ref 4.0–10.5)
nRBC: 0 % (ref 0.0–0.2)

## 2021-05-31 LAB — TYPE AND SCREEN
ABO/RH(D): B POS
Antibody Screen: NEGATIVE

## 2021-05-31 MED ORDER — OXYCODONE-ACETAMINOPHEN 5-325 MG PO TABS: 1.0000 | ORAL_TABLET | ORAL | Status: DC | PRN

## 2021-05-31 MED ORDER — FENTANYL CITRATE (PF) 100 MCG/2ML IJ SOLN
INTRAMUSCULAR | Status: AC
  Filled 2021-05-31: qty 2

## 2021-05-31 MED ORDER — ACETAMINOPHEN 325 MG PO TABS: 650.0000 mg | ORAL_TABLET | ORAL | Status: DC | PRN

## 2021-05-31 MED ORDER — LIDOCAINE HCL (PF) 1 % IJ SOLN: 30.0000 mL | INTRAMUSCULAR | Status: DC | PRN

## 2021-05-31 MED ORDER — OXYTOCIN BOLUS FROM INFUSION
333.0000 mL | Freq: Once | INTRAVENOUS | Status: AC
  Administered 2021-06-01: 333 mL via INTRAVENOUS

## 2021-05-31 MED ORDER — LACTATED RINGERS IV SOLN
500.0000 mL | INTRAVENOUS | Status: DC | PRN
  Administered 2021-06-01: 500 mL via INTRAVENOUS

## 2021-05-31 MED ORDER — TERBUTALINE SULFATE 1 MG/ML IJ SOLN: 0.2500 mg | Freq: Once | INTRAMUSCULAR | Status: DC | PRN

## 2021-05-31 MED ORDER — FLEET ENEMA 7-19 GM/118ML RE ENEM: 1.0000 | ENEMA | RECTAL | Status: DC | PRN

## 2021-05-31 MED ORDER — OXYCODONE-ACETAMINOPHEN 5-325 MG PO TABS: 2.0000 | ORAL_TABLET | ORAL | Status: DC | PRN

## 2021-05-31 MED ORDER — FENTANYL CITRATE (PF) 100 MCG/2ML IJ SOLN
100.0000 ug | INTRAMUSCULAR | Status: DC | PRN
  Administered 2021-05-31 – 2021-06-01 (×2): 100 ug via INTRAVENOUS
  Filled 2021-05-31 (×3): qty 2

## 2021-05-31 MED ORDER — SOD CITRATE-CITRIC ACID 500-334 MG/5ML PO SOLN: 30.0000 mL | ORAL | Status: DC | PRN

## 2021-05-31 MED ORDER — MISOPROSTOL 25 MCG QUARTER TABLET
25.0000 ug | ORAL_TABLET | ORAL | Status: DC | PRN
  Administered 2021-05-31 (×2): 25 ug via VAGINAL
  Filled 2021-05-31 (×2): qty 1

## 2021-05-31 MED ORDER — OXYTOCIN-SODIUM CHLORIDE 30-0.9 UT/500ML-% IV SOLN
2.5000 [IU]/h | INTRAVENOUS | Status: DC
  Filled 2021-05-31: qty 500

## 2021-05-31 MED ORDER — LACTATED RINGERS IV SOLN: INTRAVENOUS | Status: DC

## 2021-05-31 MED ORDER — ONDANSETRON HCL 4 MG/2ML IJ SOLN: 4.0000 mg | Freq: Four times a day (QID) | INTRAMUSCULAR | Status: DC | PRN

## 2021-05-31 NOTE — Progress Notes (Addendum)
Labor Progress Note Alexandra Lyons is a 21 y.o. G1P0 at [redacted]w[redacted]d presented for IOL for BPP 6/10.  S: Doing well. No pain currently but was wondering about using nitrous oxide and Ambien for pain and sleep. Wanted to know about options for pain moving forward.   O:  Temp 98.3 F (36.8 C)    LMP 08/19/2020 (Approximate)   EFM: 135bpm/moderate variability/+ accels, no decels Toco: Every 1-2 minutes  CVE: Dilation: 1 Effacement (%): Thick Station: Ballotable Presentation: Vertex Exam by: Mathis Fare, MD   [name]: 21 y.o. G1P0 [redacted]w[redacted]d  #Labor: Progressing well after 2 doses of Cytotec. Foley balloon placed without difficulty. Will hold additional dose of Cytotec given contracting every 1-2 minutes currently. #Pain: Controlled currently. Planning for IV Fentanyl as needed. #FWB: Category 1 #GBS negative  Altamease Oiler, Medical Student 9:07 PM   GME ATTESTATION:  I saw and evaluated the patient. I agree with the findings and the plan of care as documented in the students note. I have made changes to documentation as necessary.  Progressing well. FB placed around 2100. Contracting well, will hold additional dose of Cytotec at this time. Will continue to monitor. FHT Cat 1.   Evalina Field, MD OB Fellow, Faculty [facility], [facility] 05/31/2021 9:30 PM

## 2021-05-31 NOTE — Progress Notes (Signed)
Alexandra Lyons is a 21 y.o. G1P0 at [redacted]w[redacted]d   Subjective: Denies pain, unaware of contractions  Objective: LMP 08/19/2020 (Approximate)  No intake/output data recorded. No intake/output data recorded.  FHT:  FHR: 130 bpm, variability: moderate,  accelerations:  Present,  decelerations:  Absent UC:   regular, every 2-3 minutes SVE:   Dilation: Fingertip Effacement (%): 60 Exam by:: Dhairya Corales  Labs: Lab Results  Component Value Date   WBC 6.3 05/31/2021   HGB 9.4 (L) 05/31/2021   HCT 28.9 (L) 05/31/2021   MCV 88.9 05/31/2021   PLT 199 05/31/2021    Assessment / Plan: --21 y.o. G1P0 at G1P0  --Cat I tracing --GBS Negative --Cytotec #1 placed at 1215 --Now almost 1 cm, patient prefers to defer foley attempt to next exam in about 4 hours --Cytotec #2 placed at 1625 --Anticipate vaginal delivery  Darlina Rumpf, CNM 05/31/2021, 4:38 PM

## 2021-05-31 NOTE — H&P (Signed)
LABOR AND DELIVERY ADMISSION HISTORY AND PHYSICAL NOTE  Alexandra Lyons is a 21 y.o. female G1P0 with IUP at [redacted]w[redacted]d presenting for IOL following NR fetal surveillance in office this morning. She reports positive fetal movement. She denies leakage of fluid, vaginal bleeding, or contractions.   She plans on breastfeeding. Her contraception plan is: undecided.  Prenatal History/Complications: PNC at [facility]:  @[redacted]w[redacted]d , CWD, normal anatomy, posterior placenta, 57%ile, EFW 624 g  Pregnancy complications:  - N/A  Past Medical History: Past Medical History:  Diagnosis Date   Anemia    COVID-19 affecting pregnancy in third trimester 04/14/2021   Nursing Staff Provider  Office Location [facility] Dating  LMP   Language  English Anatomy 14/02/2021  incomplete  Flu Vaccine  03/26/21 Genetic/Carrier Screen  NIPS:   Low risks AFP:  Negative  Horizon:  TDaP Vaccine  03/12/21 Hgb A1C or  GTT Early  Third trimester negative  COVID Vaccine    LAB RESULTS   Rhogam  NA Blood Type B/Positive/-- (07/21 1046)   Baby Feeding Plan Breast Antibody Negative (07/21 1046)  Contraception No Rubella 2.77 (07/21 1046)  Circumcision Yes  RPR Non Reactive (11/07 0903)   Pediatrician  TAPM HBsAg Negative (07/21 1046)   Support Person Jarrett  HCVAb Negative   Prenatal Classes  HIV Non Reactive (11/07 0903)     BTL Consent NA GBS Negative/-- (12/28 1205) (For PCN allergy, check sensitivities)   VBAC Consent NA Pap Neg 11/2020       DME Rx [ ]  BP cuff [ ]  Weight Scale Waterbirth  [ ]  Class [ ]  Consent [ ]  CNM visit  PHQ9 & GAD7 [  ] new OB [x]  28 weeks  [  ] 36 weeks Induction  [ ]  Orders Entered [ ] Foley Y/N   Past Surgical History: Past Surgical History:  Procedure Laterality Date   NO PAST SURGERIES      Obstetrical History: OB History     Gravida  1   Para      Term      Preterm      AB      Living         SAB      IAB      Ectopic      Multiple      Live Births               Social History: Social History   Socioeconomic History   Marital status: Single    Spouse name: Not on file   Number of children: Not on file   Years of education: Not on file   Highest education level: Not on file  Occupational History   Not on file  Tobacco Use   Smoking status: Former    Packs/day: 0.25    Years: 1.00    Pack years: 0.25    Types: Cigarettes    Quit date: 09/01/2020    Years since quitting: 0.7   Smokeless tobacco: Never  Vaping Use   Vaping Use: Former   Quit date: 07/12/2020  Substance and Sexual Activity   Alcohol use: Not Currently   Drug use: No   Sexual activity: Yes    Birth control/protection: None  Other Topics Concern   Not on file  Social History Narrative   ** Merged History Encounter **       Social Determinants of Health   Financial Resource Strain: Not on file  Food Insecurity: Food  Insecurity Present   Worried About Programme researcher, broadcasting/film/video in the Last Year: Sometimes true   Ran Out of Food in the Last Year: Sometimes true  Transportation Needs: Personal assistant (Medical): No   Lack of Transportation (Non-Medical): Yes  Physical Activity: Not on file  Stress: Not on file  Social Connections: Not on file    Family History: Family History  Problem Relation Age of Onset   Diabetes Paternal Grandmother    Brain cancer Other     Allergies: No Known Allergies  Medications Prior to Admission  Medication Sig Dispense Refill Last Dose   ferrous sulfate (FERROUSUL) 325 (65 FE) MG tablet Take 1 tablet (325 mg total) by mouth every other day. 60 tablet 1    Prenatal Vit-Fe Fumarate-FA (MULTIVITAMIN-PRENATAL) 27-0.8 MG TABS tablet Take 1 tablet by mouth daily at 12 noon.        Review of Systems  All systems reviewed and negative except as stated in HPI  Physical Exam Last menstrual period 08/19/2020. General appearance: alert, oriented, NAD Lungs: normal respiratory effort Heart: regular  rate Abdomen: soft, non-tender; gravid, leopolds  Extremities: No calf swelling or tenderness Presentation: cephalic by BSUS  Fetal monitoring: Baseline: 140 bpm, Variability: Good {> 6 bpm), Accelerations: 10 x10 and Decelerations: none Uterine activity: Irregular q 4-8  Dilation: Fingertip  Prenatal labs: ABO, Rh: --/--/PENDING (01/26 1158) Antibody: PENDING (01/26 1158) Rubella: 2.77 (07/21 1046) RPR: Non Reactive (11/07 0903)  HBsAg: Negative (07/21 1046)  HIV: Non Reactive (11/07 0903)  GC/Chlamydia: Neg  GBS: Negative/-- (12/28 1205)  2-hr GTT: WNL  Genetic screening:  Low risk female Anatomy US: WNL  Prenatal Transfer Tool  Maternal Diabetes: No Genetic Screening: Normal Maternal Ultrasounds/Referrals: Normal Fetal Ultrasounds or other Referrals:  None Maternal Substance Abuse:  No Significant Maternal Medications:  None Significant Maternal Lab Results: Group B Strep negative  Results for orders placed or performed during the hospital encounter of 05/31/21 (from the past 24 hour(s))  Type and screen   Collection Time: 05/31/21 11:58 AM  Result Value Ref Range   ABO/RH(D) PENDING    Antibody Screen PENDING    Sample Expiration      06/03/2021,2359 Performed at [facility], [address] [place], [place] [address]     Patient Active Problem List   Diagnosis Date Noted   Post term pregnancy 05/31/2021   Supervision of low-risk first pregnancy, third trimester 11/23/2020    Assessment: Alexandra Lyons is a 21 y.o. G1P0 at [redacted]w[redacted]d here for IOL s/p NR fetal surveillance in office  #Labor: FT on admission. Vertex confirmed with BSUS. Cytotec PV now, assess for foley balloon with subsequent exams #Pain: IV pain meds PRN, epidural per maternal request once in active labor #FWB: Cat I tracing #GBS/ID: Negative #COVID: swab pending #MOF: Breast #MOC: Undecided, originally considered BTL, open to ongoing discussion. Encouraged consideration of  LARCs #Circ: Yes, inpatient   Calvert Cantor, CNM 05/31/2021, 12:30 PM

## 2021-05-31 NOTE — Progress Notes (Signed)
Pt informed that the ultrasound is considered a limited OB ultrasound and is not intended to be a complete ultrasound exam.  Patient also informed that the ultrasound is not being completed with the intent of assessing for fetal or placental anomalies or any pelvic abnormalities.  Explained that the purpose of todays ultrasound is to assess for  BPP, presentation, and AFI.  Patient acknowledges the purpose of the exam and the limitations of the study.     Koren Bound RN BSN 05/31/21

## 2021-05-31 NOTE — Progress Notes (Signed)
° °  PRENATAL VISIT NOTE  Subjective:  Alexandra Lyons is a 21 y.o. G1P0 at [redacted]w[redacted]d being seen today for ongoing prenatal care.  She is currently monitored for the following issues for this low-risk pregnancy and has Supervision of low-risk first pregnancy, third trimester on their problem list.  Patient reports no complaints.  Contractions: Irregular. Vag. Bleeding: None.  Movement: Present. Denies leaking of fluid.   The following portions of the patient's history were reviewed and updated as appropriate: allergies, current medications, past family history, past medical history, past social history, past surgical history and problem list.   Objective:   Vitals:   05/31/21 0830  BP: 126/69  Pulse: 83  Weight: 223 lb (101.2 kg)    Fetal Status: Fetal Heart Rate (bpm): NST   Movement: Present     General:  Alert, oriented and cooperative. Patient is in no acute distress.  Skin: Skin is warm and dry. No rash noted.   Cardiovascular: Normal heart rate noted  Respiratory: Normal respiratory effort, no problems with respiration noted  Abdomen: Soft, gravid, appropriate for gestational age.  Pain/Pressure: Present     Pelvic: Cervical exam deferred        Extremities: Normal range of motion.  Edema: None  Mental Status: Normal mood and affect. Normal behavior. Normal judgment and thought content.   Assessment and Plan:  Pregnancy: G1P0 at [redacted]w[redacted]d 1. Supervision of low-risk first pregnancy, third trimester  Given patient's gestational age,  NST technically had two accelerations but sometimes with minimal variability and given that patient's BPP was 6/8, will send to L and D for direct admit.   -Long discussion with patient about methods of induction, epidural, pitocin, etc. Discussed with patient the fact that IOL can take a long time, that the patient may eat, shower, walk around unless with epidural -anticipatory guidance given regarding how decisions are made in hospital, emphasizing  shared decision making and that as long as mom and baby are healthy, a C/section would not be necessary.   Term labor symptoms and general obstetric precautions including but not limited to vaginal bleeding, contractions, leaking of fluid and fetal movement were reviewed in detail with the patient. Please refer to After Visit Summary for other counseling recommendations.   No follow-ups on file.  Future Appointments  Date Time Provider Department Center  05/31/2021  9:35 AM Madlyn Frankel [facility]  05/31/2021  9:55 AM [facility]  06/02/2021  6:30 AM [facility] SCHED ROOM [facility] None    Marylene Land, CNM

## 2021-06-01 ENCOUNTER — Encounter (HOSPITAL_COMMUNITY): Payer: Self-pay | Admitting: Obstetrics and Gynecology

## 2021-06-01 ENCOUNTER — Inpatient Hospital Stay (HOSPITAL_COMMUNITY): Payer: Medicaid Other | Admitting: Anesthesiology

## 2021-06-01 DIAGNOSIS — O480 Post-term pregnancy: Secondary | ICD-10-CM

## 2021-06-01 DIAGNOSIS — Z3A40 40 weeks gestation of pregnancy: Secondary | ICD-10-CM

## 2021-06-01 LAB — RPR: RPR Ser Ql: NONREACTIVE

## 2021-06-01 MED ORDER — OXYTOCIN-SODIUM CHLORIDE 30-0.9 UT/500ML-% IV SOLN
1.0000 m[IU]/min | INTRAVENOUS | Status: DC
  Administered 2021-06-01: 2 m[IU]/min via INTRAVENOUS

## 2021-06-01 MED ORDER — ONDANSETRON HCL 4 MG PO TABS: 4.0000 mg | ORAL_TABLET | ORAL | Status: DC | PRN

## 2021-06-01 MED ORDER — MISOPROSTOL 50MCG HALF TABLET
ORAL_TABLET | ORAL | Status: AC
  Filled 2021-06-01: qty 1

## 2021-06-01 MED ORDER — MISOPROSTOL 50MCG HALF TABLET
50.0000 ug | ORAL_TABLET | ORAL | Status: DC | PRN
  Administered 2021-06-01: 50 ug via BUCCAL

## 2021-06-01 MED ORDER — LACTATED RINGERS IV SOLN: 500.0000 mL | Freq: Once | INTRAVENOUS | Status: DC

## 2021-06-01 MED ORDER — DIPHENHYDRAMINE HCL 50 MG/ML IJ SOLN: 12.5000 mg | INTRAMUSCULAR | Status: DC | PRN

## 2021-06-01 MED ORDER — TRANEXAMIC ACID-NACL 1000-0.7 MG/100ML-% IV SOLN: 1000.0000 mg | Freq: Once | INTRAVENOUS | Status: DC

## 2021-06-01 MED ORDER — ONDANSETRON HCL 4 MG/2ML IJ SOLN: 4.0000 mg | INTRAMUSCULAR | Status: DC | PRN

## 2021-06-01 MED ORDER — PHENYLEPHRINE 40 MCG/ML (10ML) SYRINGE FOR IV PUSH (FOR BLOOD PRESSURE SUPPORT): 80.0000 ug | PREFILLED_SYRINGE | INTRAVENOUS | Status: DC | PRN

## 2021-06-01 MED ORDER — FENTANYL-BUPIVACAINE-NACL 0.5-0.125-0.9 MG/250ML-% EP SOLN
EPIDURAL | Status: AC
  Filled 2021-06-01: qty 250

## 2021-06-01 MED ORDER — TERBUTALINE SULFATE 1 MG/ML IJ SOLN: 0.2500 mg | Freq: Once | INTRAMUSCULAR | Status: DC | PRN

## 2021-06-01 MED ORDER — LIDOCAINE HCL (PF) 1 % IJ SOLN
INTRAMUSCULAR | Status: DC | PRN
  Administered 2021-06-01: 4 mL via EPIDURAL
  Administered 2021-06-01: 6 mL via EPIDURAL

## 2021-06-01 MED ORDER — BENZOCAINE-MENTHOL 20-0.5 % EX AERO
1.0000 "application " | INHALATION_SPRAY | CUTANEOUS | Status: DC | PRN
  Administered 2021-06-01: 1 via TOPICAL
  Filled 2021-06-01: qty 56

## 2021-06-01 MED ORDER — DIBUCAINE (PERIANAL) 1 % EX OINT: 1.0000 "application " | TOPICAL_OINTMENT | CUTANEOUS | Status: DC | PRN

## 2021-06-01 MED ORDER — EPHEDRINE 5 MG/ML INJ: 10.0000 mg | INTRAVENOUS | Status: DC | PRN

## 2021-06-01 MED ORDER — FENTANYL CITRATE (PF) 100 MCG/2ML IJ SOLN
INTRAMUSCULAR | Status: DC | PRN
  Administered 2021-06-01: 100 ug via EPIDURAL

## 2021-06-01 MED ORDER — TETANUS-DIPHTH-ACELL PERTUSSIS 5-2.5-18.5 LF-MCG/0.5 IM SUSY: 0.5000 mL | PREFILLED_SYRINGE | Freq: Once | INTRAMUSCULAR | Status: DC

## 2021-06-01 MED ORDER — IBUPROFEN 600 MG PO TABS
600.0000 mg | ORAL_TABLET | Freq: Four times a day (QID) | ORAL | Status: DC
  Administered 2021-06-01 – 2021-06-03 (×7): 600 mg via ORAL
  Filled 2021-06-01 (×8): qty 1

## 2021-06-01 MED ORDER — COCONUT OIL OIL: 1.0000 "application " | TOPICAL_OIL | Status: DC | PRN

## 2021-06-01 MED ORDER — WITCH HAZEL-GLYCERIN EX PADS: 1.0000 "application " | MEDICATED_PAD | CUTANEOUS | Status: DC | PRN

## 2021-06-01 MED ORDER — DIPHENHYDRAMINE HCL 25 MG PO CAPS: 25.0000 mg | ORAL_CAPSULE | Freq: Four times a day (QID) | ORAL | Status: DC | PRN

## 2021-06-01 MED ORDER — ACETAMINOPHEN 325 MG PO TABS
650.0000 mg | ORAL_TABLET | ORAL | Status: DC | PRN
  Administered 2021-06-03: 650 mg via ORAL
  Filled 2021-06-01: qty 2

## 2021-06-01 MED ORDER — FENTANYL-BUPIVACAINE-NACL 0.5-0.125-0.9 MG/250ML-% EP SOLN
12.0000 mL/h | EPIDURAL | Status: DC | PRN
  Administered 2021-06-01: 12 mL/h via EPIDURAL

## 2021-06-01 MED ORDER — PRENATAL MULTIVITAMIN CH
1.0000 | ORAL_TABLET | Freq: Every day | ORAL | Status: DC
  Administered 2021-06-02: 1 via ORAL
  Filled 2021-06-01: qty 1

## 2021-06-01 MED ORDER — SENNOSIDES-DOCUSATE SODIUM 8.6-50 MG PO TABS
2.0000 | ORAL_TABLET | Freq: Every day | ORAL | Status: DC
  Administered 2021-06-02: 2 via ORAL
  Filled 2021-06-01: qty 2

## 2021-06-01 MED ORDER — TRANEXAMIC ACID-NACL 1000-0.7 MG/100ML-% IV SOLN
INTRAVENOUS | Status: AC
  Administered 2021-06-01: 1000 mg
  Filled 2021-06-01: qty 100

## 2021-06-01 MED ORDER — ZOLPIDEM TARTRATE 5 MG PO TABS: 5.0000 mg | ORAL_TABLET | Freq: Every evening | ORAL | Status: DC | PRN

## 2021-06-01 MED ORDER — BUPIVACAINE HCL (PF) 0.25 % IJ SOLN
INTRAMUSCULAR | Status: DC | PRN
  Administered 2021-06-01: 8 mL via EPIDURAL

## 2021-06-01 MED ORDER — SIMETHICONE 80 MG PO CHEW: 80.0000 mg | CHEWABLE_TABLET | ORAL | Status: DC | PRN

## 2021-06-01 NOTE — Lactation Note (Signed)
This note was copied from a baby's chart. Lactation Consultation Note  Patient Name: Alexandra Lyons [id number]'D Date: 06/01/2021 Reason for consult: Initial assessment;Primapara;1st time breastfeeding;Term Age:23 hours  Initial visit to 5 hours old infant of a P1 mother. Mother requests assistance with latch. Demonstrated hand expression, collected ~2-mL and spoonfed. Infant latches but loses depth after a few sucks. Noted compressed nipple and mother describes pain with latch. Nipple is short shafted. Provided hand pump for nipple eversion, verbalized discomfort. Mother expresses interest for donor milk as preferred form of supplementation, if needed.  Discussed normal newborn behavior and patterns, signs of good milk transfer, hunger cues, tummy size and benefits of skin to skin.  FOB is very supportive and receptive to education.  Plan: 1-Breastfeeding on demand or 8-12 times in 24h period. 2-Use manual pump as needed 3-Encouraged maternal rest, hydration and food intake.   Contact LC as needed for feeds/support/concerns/questions. All questions answered at this time. Provided Lactation services brochure and promoted [employer] booklet information.     Maternal Data Has patient been taught Hand Expression?: Yes Does the patient have breastfeeding experience prior to this delivery?: No  Feeding Mother's Current Feeding Choice: Breast Milk  LATCH Score Latch: Repeated attempts needed to sustain latch, nipple held in mouth throughout feeding, stimulation needed to elicit sucking reflex.  Audible Swallowing: A few with stimulation  Type of Nipple: Everted at rest and after stimulation (short shafted, needs prepumping)  Comfort (Breast/Nipple): Soft / non-tender  Hold (Positioning): Assistance needed to correctly position infant at breast and maintain latch.  LATCH Score: 7   Lactation Tools Discussed/Used Tools: Pump;Flanges Flange Size: 24;21 Breast pump type: Manual Pump  Education: Setup, frequency, and cleaning;Milk Storage Reason for Pumping: nipple eversion Pumping frequency: as needed  Interventions Interventions: Breast feeding basics reviewed;Assisted with latch;Skin to skin;Breast massage;Hand express;Adjust position;Breast compression;Hand pump;Pre-pump if needed;Expressed milk;Position options;Support pillows;Education;LC Services brochure  Discharge Pump: Manual;Personal WIC Program: Yes  Consult Status Consult Status: Follow-up Date: 06/02/21 Follow-up type: In-patient    Ciin Brazzel A Higuera Ancidey 06/01/2021, 11:27 PM

## 2021-06-01 NOTE — Progress Notes (Signed)
Labor Progress Note Alexandra Lyons is a 21 y.o. G1P0 at [redacted]w[redacted]d who presented for IOL due to BPP 6/10.   S: Coping well. No concerns.   O:  BP (!) 112/55    Pulse 70    Temp 98.3 F (36.8 C)    Resp 16    LMP 08/19/2020 (Approximate)   EFM: Baseline 130 bpm, min-mod variability, + accels, no decels  Toco: Every 1-3 minutes   CVE: Dilation: 4 Effacement (%): 50 Station: -3 Presentation: Vertex Exam by:: Mellody Dance, RN  [name]: 21 y.o. G1P0 [redacted]w[redacted]d   #Labor: Progressing well s/p Cytotec and FB placement. FB now out. Additional dose of Cytotec given. Plan to reassess in 4 hours.  #Pain: PRN #FWB: Intermittently Cat 2 due to periods of minimal variability. Continues to then return to moderate variability with accels. Will continue to monitor closely.  #GBS negative  Worthy Rancher, MD 3:19 AM;

## 2021-06-01 NOTE — Progress Notes (Addendum)
Alexandra Lyons is a 21 y.o. G1P0 at [redacted]w[redacted]d by LMP admitted for induction of labor due to Non-reactive NST and BPP 6/10.  Subjective: Pt doing well, she feels pressure in her rectum with "each contraction". She would like to eat a snack.   Objective: Pt sleeping upon entry to the room. Support persons remain at bedside (FOB). BP (!) 111/54    Pulse 79    Temp 98.8 F (37.1 C)    Resp 16    LMP 08/19/2020 (Approximate)    SpO2 100%  No intake/output data recorded. No intake/output data recorded.  FHT:  FHR: 140's bpm, variability: minimal ,  accelerations:  Abscent,  decelerations:  Present late UC:   regular, every 2-4 minutes, 70-100 secs, moderate to palpation, relaxation between contractions SVE:   Dilation: Lip/rim Effacement (%): 100 Station: Plus 1 Exam by:: Xaiver Roskelley  Labs: Lab Results  Component Value Date   WBC 6.3 05/31/2021   HGB 9.4 (L) 05/31/2021   HCT 28.9 (L) 05/31/2021   MCV 88.9 05/31/2021   PLT 199 05/31/2021    Assessment / Plan: Labor:  Active labor: Progressing well, augmentation with low dose pitocin per protocol. SVE: cervix noted from 12-6 o'clock.  Reassurance given that pressure is normal, pt to push call bell if uncontrolled urge to push, unlikely given epidural. Position change to right lateral, peanut ball placed between knees. .  Will reassess in 2-3 hours PRN. Will continue to monitor.    Fetal Wellbeing:  Category II-decreased variability with intermittent late decels, continue position changes.  Lates resolved. Fetal scalp stimulation noted during exam. If continues will consider d/c  Pain Control:  Epidural I/D:   GBS negative Anticipated MOD:  NSVD  Alexandra Lyons 06/01/2021, 3:25 PM  Attestation of Supervision of Student:  I confirm that I have verified the information documented in the nurse midwife students note and that I have also personally reperformed the history, physical exam and all medical decision making activities.  I have  verified that all services and findings are accurately documented in this student's note; and I agree with management and plan as outlined in the documentation. I have also made any necessary editorial changes.  [name]  [facility] [employer], [facility] 06/01/2021 3:25 PM

## 2021-06-01 NOTE — Anesthesia Preprocedure Evaluation (Signed)
Anesthesia Evaluation  Patient identified by MRN, date of birth, ID band Patient awake    Reviewed: Allergy & Precautions, H&P , NPO status , Patient's Chart, lab work & pertinent test results  History of Anesthesia Complications Negative for: history of anesthetic complications  Airway Mallampati: II  TM Distance: >3 FB     Dental   Pulmonary neg pulmonary ROS, former smoker,    Pulmonary exam normal        Cardiovascular negative cardio ROS   Rhythm:regular Rate:Normal     Neuro/Psych negative neurological ROS  negative psych ROS   GI/Hepatic negative GI ROS, Neg liver ROS,   Endo/Other  negative endocrine ROS  Renal/GU negative Renal ROS  negative genitourinary   Musculoskeletal   Abdominal   Peds  Hematology  (+) Blood dyscrasia, anemia ,   Anesthesia Other Findings   Reproductive/Obstetrics (+) Pregnancy                             Anesthesia Physical Anesthesia Plan  ASA: 2  Anesthesia Plan: Epidural   Post-op Pain Management:    Induction:   PONV Risk Score and Plan:   Airway Management Planned:   Additional Equipment:   Intra-op Plan:   Post-operative Plan:   Informed Consent: I have reviewed the patients History and Physical, chart, labs and discussed the procedure including the risks, benefits and alternatives for the proposed anesthesia with the patient or authorized representative who has indicated his/her understanding and acceptance.       Plan Discussed with:   Anesthesia Plan Comments:         Anesthesia Quick Evaluation  

## 2021-06-01 NOTE — Progress Notes (Addendum)
Alexandra Lyons is a 21 y.o. G1P0 at [redacted]w[redacted]d by LMP admitted for induction of labor due to Non-reactive NST/BPP 6 out of 10.  Subjective: Pt reports doing well, no pain or concerns.    Objective: Pt accompanied by FOB and grandmother.   BP 129/80    Pulse (!) 103    Temp 98.9 F (37.2 C) (Oral)    Resp 16    LMP 08/19/2020 (Approximate)    SpO2 100%  No intake/output data recorded. No intake/output data recorded.  FHT:  FHR: 130's bpm, variability: moderate,  accelerations:  Present,  decelerations:  Absent UC:   regular, every 2-3 minutes, lasts 60 seconds SVE:   Dilation: 6.5 Effacement (%): 100 Station: -1 Exam by:: Lyons snm  Labs: Lab Results  Component Value Date   WBC 6.3 05/31/2021   HGB 9.4 (L) 05/31/2021   HCT 28.9 (L) 05/31/2021   MCV 88.9 05/31/2021   PLT 199 05/31/2021    Assessment / Plan: Labor:  Active labor: progressing will.  S/P Cytotec and FB.  Discussed R/B/A to AROM.  No questions. Pt verbalized consent.  AROM completed: small amount of clear fluid. Plan to reassess in 4 hours PRN.  Fetal Wellbeing:  Category II- scalp stimulation with AROM.  Continue position changes.  Continue to access.  Pain Control:  Epidural I/D:   GBS negative Anticipated MOD:  NSVD  Alexandra Lyons 06/01/2021, 9:44 AM   Attestation of Supervision of Student:  I confirm that I have verified the information documented in the nurse midwife students note and that I have also personally reperformed the history, physical exam and all medical decision making activities.  I have verified that all services and findings are accurately documented in this student's note; and I agree with management and plan as outlined in the documentation. I have also made any necessary editorial changes.   Marylene Land, CNM [facility] [employer], [facility] 06/01/2021 11:20 AM

## 2021-06-01 NOTE — Anesthesia Procedure Notes (Signed)
Epidural Patient location during procedure: OB Start time: 06/01/2021 6:30 AM End time: 06/01/2021 6:41 AM  Staffing Anesthesiologist: Lucretia Kern, MD Performed: anesthesiologist   Preanesthetic Checklist Completed: patient identified, IV checked, risks and benefits discussed, monitors and equipment checked, pre-op evaluation and timeout performed  Epidural Patient position: sitting Prep: DuraPrep Patient monitoring: heart rate, continuous pulse ox and blood pressure Approach: midline Location: L3-L4 Injection technique: LOR air  Needle:  Needle type: Tuohy  Needle gauge: 17 G Needle length: 9 cm Needle insertion depth: 7 cm Catheter type: closed end flexible Catheter size: 19 Gauge Catheter at skin depth: 12 cm Test dose: negative  Assessment Events: blood not aspirated, injection not painful, no injection resistance, no paresthesia and negative IV test  Additional Notes Reason for block:procedure for pain

## 2021-06-01 NOTE — Progress Notes (Addendum)
Alexandra Lyons is a 21 y.o. G1P0 at [redacted]w[redacted]d by LMP admitted for induction of labor due to Non-reactive NST and BPP 6/10.  Subjective: Pt doing well and pain relief with epidural re-dose.  She continues to cope well, no questions.   Objective: Pt sleeping upon entry to the room. Support persons remain at bedside (FOB, both grandmothers). BP 124/63    Pulse 88    Temp 98.3 F (36.8 C)    Resp 16    LMP 08/19/2020 (Approximate)    SpO2 100%  No intake/output data recorded. No intake/output data recorded.  FHT:  FHR: 140's bpm, variability: minimal ,  accelerations:  Abscent,  decelerations:  Absent UC:   regular, every 1-3 minutes, 70-90-120 secs, moderate variability, relaxation between contractions SVE:   Dilation: 6.5 Effacement (%): 100 Station: -1 Exam by:: m wilkins rnc  Labs: Lab Results  Component Value Date   WBC 6.3 05/31/2021   HGB 9.4 (L) 05/31/2021   HCT 28.9 (L) 05/31/2021   MCV 88.9 05/31/2021   PLT 199 05/31/2021    Assessment / Plan: Labor:  Active labor: Contractions q 1-3 minutes, SVE unchanged.  Discussed R/B/A to IUPC. Pt gave verbal consent. Posterior placenta: IUPC placed without difficulty.  Will reassess in 2-3 hours if no cervical change discussed with patient and partner: consider augmentation with pitocin if feta; status is reassuring and no cervical change. Will continue to monitor.    Fetal Wellbeing:  Category II-decreased variability without decels, continue position changes, fetal scalp stimulation noted during exam.   Pain Control:  Epidural I/D:   GBS negative Anticipated MOD:  NSVD  Alexandra Lyons 06/01/2021, 12:27 PM  Attestation of Supervision of Student:  I confirm that I have verified the information documented in the nurse midwife students note and that I have also personally reperformed the history, physical exam and all medical decision making activities.  I have verified that all services and findings are accurately documented in  this student's note; and I agree with management and plan as outlined in the documentation. I have also made any necessary editorial changes.    Marylene Land, CNM [facility] [employer], [facility] 06/01/2021 1:39 PM

## 2021-06-01 NOTE — Discharge Summary (Signed)
Postpartum Discharge Summary     Patient Name: Alexandra Lyons DOB: [birth year] MRN: [id number]  Date of admission: 05/31/2021 Delivery date:06/01/2021  Delivering provider: Olga Coaster  Date of discharge: 06/03/2021  Admitting diagnosis: Post term pregnancy [O48.0] Intrauterine pregnancy: [redacted]w[redacted]d    Secondary diagnosis:  Principal Problem:   Post term pregnancy  Additional problems: None   Discharge diagnosis: Term Pregnancy Delivered                                              Post partum procedures: None Augmentation: AROM, Pitocin, and Cytotec Complications: None  Hospital course: Induction of Labor With Vaginal Delivery   21y.o. yo G1P0 at 419w6das admitted to the hospital 05/31/2021 for induction of labor.  Indication for induction: Postdates and BPP 6/10 .  Patient had an uncomplicated labor course as follows: [facility] at her prenatal visit on 05/31/2021. She received cytotec and FB overnight; AROM this morning at 9 am and IUPC placed at noon. She was started on pitocin, titrated up to 4 mu and then pushed for 1 hour. FHR had repetitive variables after contractions with spontaneous return to baseline; Dr. BaElgie Congoas at the bedside for delivery as back-up.  Membrane Rupture Time/Date: 8:48 AM ,06/01/2021   Delivery Method:Vaginal, Spontaneous  Episiotomy: None  Lacerations:  Sulcus  Details of delivery can be found in separate delivery note.  Patient had a routine postpartum course. Patient is discharged home 06/03/21.  Newborn Data: Birth date:06/01/2021  Birth time:5:37 PM  Gender:Female  Living status:Living  Apgars:7 ,9  Weight:3720 g   Magnesium Sulfate received: No BMZ received: No Rhophylac:No MMR:N/A T-DaP:Given prenatally Flu: N/A Given prenatally Transfusion:No  Physical exam  Vitals:   06/02/21 1204 06/02/21 1525 06/02/21 2025 06/03/21 0552  BP: (!) 100/51 106/75 (!) 117/59 98/82  Pulse: 95 90 91 70  Resp: _0 Temp: 98.1 F (36.7 C) 98.3 F (36.8 C) 98.7 F (37.1 C) 98 F (36.7 C)  TempSrc: Axillary Oral Oral Oral  SpO2: 100% 100% 100% 100%   General: alert, cooperative, and no distress Lochia: appropriate Uterine Fundus: firm Incision: N/A DVT Evaluation: No significant calf/ankle edema. Labs: Lab Results  Component Value Date   WBC 6.3 05/31/2021   HGB 9.4 (L) 05/31/2021   HCT 28.9 (L) 05/31/2021   MCV 88.9 05/31/2021   PLT 199 05/31/2021   No flowsheet data found. Edinburgh Score: No flowsheet data found.   After visit meds:  Allergies as of 06/03/2021   No Known Allergies      Medication List     TAKE these medications    acetaminophen 325 MG tablet Commonly known as: Tylenol Take 2 tablets (650 mg total) by mouth every 4 (four) hours as needed (for pain scale < 4).   ferrous sulfate 325 (65 FE) MG tablet Commonly known as: FerrouSul Take 1 tablet (325 mg total) by mouth every other day.   ibuprofen 600 MG tablet Commonly known as: ADVIL Take 1 tablet (600 mg total) by mouth every 6 (six) hours.   multivitamin-prenatal 27-0.8 MG Tabs tablet Take 1 tablet by mouth daily at 12 noon.         Discharge home in stable condition Infant Feeding: Breast Infant Disposition:home with mother Discharge instruction: per After  Visit Summary and Postpartum booklet. Activity: Advance as tolerated. Pelvic rest for 6 weeks.  Diet: routine diet Future Appointments: Future Appointments  Date Time Provider [place]  07/04/2021  3:35 PM Starr Lake, CNM [facility]   Follow up Visit:  [place] [facility]. Go in 6 day(s).   Specialty: Obstetrics and Gynecology Contact information: [place] [phone]                 Please schedule this patient for a In person postpartum visit in 4 weeks -6 weeks with the  following provider:  with [place] . Additional Postpartum F/U: None   Low risk pregnancy complicated by:  nothing Delivery mode:  Vaginal, Spontaneous  Anticipated Birth Control:  Unsure; not interested in medication at this time   06/03/2021 Annalee Genta, DO

## 2021-06-01 NOTE — Lactation Note (Signed)
This note was copied from a baby's chart. Lactation Consultation Note  Patient Name: Alexandra Lyons [id number] Date: 06/01/2021 Reason for consult: L&D Initial assessment;1st time breastfeeding;Primapara;Term;Breastfeeding assistance;Other (Comment) (LC 1st came to see mom at 64m and mom still being repaired. 2nd visit was > 60 mins. Baby STS, LC offered to assist and baby latched several times on / off strong sucks. ( areola edema noted.) Age:23 hours  Maternal Data Does the patient have breastfeeding experience prior to this delivery?: No  Feeding Mother's Current Feeding Choice: Breast Milk  LATCH Score Latch: Repeated attempts needed to sustain latch, nipple held in mouth throughout feeding, stimulation needed to elicit sucking reflex.  Audible Swallowing: None  Type of Nipple: Flat (areola edema - semi compressible)  Comfort (Breast/Nipple): Soft / non-tender  Hold (Positioning): Assistance needed to correctly position infant at breast and maintain latch.  LATCH Score: 5   Lactation Tools Discussed/Used    Interventions Interventions: Breast feeding basics reviewed;Assisted with latch;Skin to skin;Reverse pressure;Adjust position  Discharge [facility] Program: Yes (per chart)  Consult Status Consult Status: Follow-up from L&D Date: 06/01/21 Follow-up type: In-patient    Matilde Sprang Chyna Kneece 06/01/2021, 7:05 PM

## 2021-06-02 ENCOUNTER — Inpatient Hospital Stay (HOSPITAL_COMMUNITY): Payer: Medicaid Other

## 2021-06-02 ENCOUNTER — Encounter (HOSPITAL_COMMUNITY): Payer: Self-pay | Admitting: Obstetrics and Gynecology

## 2021-06-02 MED ORDER — POLYETHYLENE GLYCOL 3350 17 G PO PACK
17.0000 g | PACK | Freq: Every day | ORAL | Status: DC
  Administered 2021-06-02: 17 g via ORAL
  Filled 2021-06-02: qty 1

## 2021-06-02 NOTE — Plan of Care (Signed)
Problem: Education: Goal: Knowledge of condition will improve Outcome: Completed/Met

## 2021-06-02 NOTE — Lactation Note (Signed)
This note was copied from a baby's chart. Lactation Consultation Note  Patient Name: Boy Shatora Weatherbee NUUVO'[id number] Date: 06/02/2021 Reason for consult: Follow-up assessment;Mother's request;1st time breastfeeding Age:23 hours  P1, Baby has been spitty. Provided education to mother on feeding frequency. Feed on demand with cues.  Goal 8-12+ times per day after first 24 hrs.  Place baby STS if not cueing.  Attempted latching but at this time baby was spitty and not interested in feeding. Suggest calling for assistance with latching later today as needed.  Had mother prepump with hand pump before latching and positioned pillows for football hold.    Feeding Mother's Current Feeding Choice: Breast Milk    Lactation Tools Discussed/Used Tools: Pump Breast pump type: Manual Reason for Pumping: help evert nipple  Interventions Interventions: Breast feeding basics reviewed;Skin to skin;Education  Discharge    Consult Status Consult Status: Follow-up Date: 06/02/21 Follow-up type: In-patient    Dahlia Byes [facility] 06/02/2021, 10:50 AM

## 2021-06-02 NOTE — Lactation Note (Signed)
This note was copied from a baby's chart. Lactation Consultation Note  Patient Name: Alexandra Lyons Date: 06/02/2021 Reason for consult: Follow-up assessment;Mother's request;Difficult latch;Primapara;1st time breastfeeding;Nipple pain/trauma;Breastfeeding assistance Age:23 hours LC assisted with latching after spoon feeding 1 ml. Best latch position cross cradle prone with signs of milk transfer.  Infant still latched and feeding at the end of the visit.   Mom had nipple pain with previous latches. RN to provided coconut oil for nipple care.  Mom to pre pump 5-10 min prior to latching to elongate nipple.  All questions answered at the end of the visit.  Maternal Data Has patient been taught Hand Expression?: Yes Does the patient have breastfeeding experience prior to this delivery?: No  Feeding Mother's Current Feeding Choice: Breast Milk  LATCH Score Latch: Repeated attempts needed to sustain latch, nipple held in mouth throughout feeding, stimulation needed to elicit sucking reflex.  Audible Swallowing: Spontaneous and intermittent  Type of Nipple: Flat (will evert with pre pumping and stimulation)  Comfort (Breast/Nipple): Soft / non-tender (pain of 1 resolved with changes made with the latch.)  Hold (Positioning): Assistance needed to correctly position infant at breast and maintain latch.  LATCH Score: 7   Lactation Tools Discussed/Used Tools: Pump Flange Size: 24 Breast pump type: Manual Pump Education: Setup, frequency, and cleaning;Milk Storage Reason for Pumping: elongate nipple Pumping frequency: pre pump 5-10 min before latching  Interventions Interventions: Breast feeding basics reviewed;Assisted with latch;Skin to skin;Breast massage;Hand express;Breast compression;Support pillows;Position options;Expressed milk;Hand pump;Education;Visual merchandiser education  Discharge    Consult Status Consult Status: Follow-up Date:  06/03/21 Follow-up type: In-patient    Corinne Goucher  Nicholson-Springer 06/02/2021, 2:50 PM

## 2021-06-02 NOTE — Anesthesia Postprocedure Evaluation (Signed)
Anesthesia Post Note  Patient: Alexandra Lyons  Procedure(s) Performed: AN AD HOC LABOR EPIDURAL     Patient location during evaluation: Mother Baby Anesthesia Type: Epidural Level of consciousness: awake and alert Pain management: pain level controlled Vital Signs Assessment: post-procedure vital signs reviewed and stable Respiratory status: spontaneous breathing, nonlabored ventilation and respiratory function stable Cardiovascular status: stable Postop Assessment: no headache, no backache and epidural receding Anesthetic complications: no   No notable events documented.  Last Vitals:  Vitals:   06/02/21 0030 06/02/21 0530  BP: 128/68 108/60  Pulse: 93 67  Resp: 19 18  Temp: 36.7 C 36.7 C  SpO2:      Last Pain:  Vitals:   06/02/21 0531  TempSrc:   PainSc: 0-No pain   Pain Goal:                   [facility] Records

## 2021-06-02 NOTE — Progress Notes (Signed)
Post Partum Day #1 Subjective: no complaints, up ad lib, and tolerating PO; breastfeeding going well; unsure re contraception; she desires a circumcision for her son- consent was reviewed and a note placed in the baby's chart  Objective: Blood pressure 108/60, pulse 67, temperature 98.1 F (36.7 C), temperature source Oral, resp. rate 18, last menstrual period 08/19/2020, SpO2 100 %, unknown if currently breastfeeding.  Physical Exam:  General: alert, cooperative, and no distress Lochia: appropriate Uterine Fundus: firm DVT Evaluation: No evidence of DVT seen on physical exam.  Recent Labs    05/31/21 1158  HGB 9.4*  HCT 28.9*    Assessment/Plan: Plan for discharge tomorrow and Circumcision prior to discharge   LOS: 2 days   Alexandra Lyons CNM 06/02/2021, 8:52 AM

## 2021-06-03 MED ORDER — ACETAMINOPHEN 325 MG PO TABS: 650.0000 mg | ORAL_TABLET | ORAL | Status: DC | PRN

## 2021-06-03 MED ORDER — IBUPROFEN 600 MG PO TABS: 600.0000 mg | ORAL_TABLET | Freq: Four times a day (QID) | ORAL | 0 refills | Status: DC

## 2021-06-03 NOTE — Procedures (Deleted)
  The note originally documented on this encounter has been moved the the encounter in which it belongs.  

## 2021-06-03 NOTE — Lactation Note (Addendum)
This note was copied from a baby's chart. Lactation Consultation Note  Patient Name: Alexandra Lyons [id number] Date: 06/03/2021 Reason for consult: Follow-up assessment Age:23 hours  P1, Mother's nipples sore.   For soreness suggest mother apply ebm or personal nipple cream while wearing shells and alternate with comfort gels. Discussed maintaining deep latch.  Suggest calling for latch assistance as needed.  Comfort gels gave mother instant relief.   Reviewed engorgement care and monitoring voids/stools. Provided written instruction on nipple soreness care.   Feeding Mother's Current Feeding Choice: Breast Milk   Lactation Tools Discussed/Used Tools: Pump;Shells;Comfort gels Flange Size: 24 Breast pump type: Manual  Interventions Interventions: Breast feeding basics reviewed;Comfort gels;Shells;Hand pump;Education  Discharge Discharge Education: Engorgement and breast care;Warning signs for feeding baby Pump: Personal;DEBP  Consult Status Consult Status: Complete Date: 06/04/21    Dahlia Byes [facility] 06/03/2021, 9:54 AM

## 2021-06-04 NOTE — Progress Notes (Signed)
Patient seen and assessed by nursing staff.  Agree with documentation and plan.  NST:  Baseline: 125 bpm, Variability: Good {> 6 bpm), Accelerations: non-reactive, and Decelerations: Absent

## 2021-06-05 ENCOUNTER — Encounter (HOSPITAL_COMMUNITY): Payer: Medicaid Other

## 2021-06-07 ENCOUNTER — Encounter: Payer: Self-pay | Admitting: Certified Nurse Midwife

## 2021-06-07 ENCOUNTER — Encounter: Payer: Self-pay | Admitting: Student

## 2021-06-08 ENCOUNTER — Telehealth: Payer: Self-pay | Admitting: General Practice

## 2021-06-08 NOTE — Telephone Encounter (Signed)
Called patient regarding mychart message and asked for more information about what was going on. Patient states her bleeding slowed down a lot by the time she went home but then Tuesday or Wednesday it picked back up again. She reports passing occasional small clots and a golf ball sized clot last night. She denies heavy bleeding of saturating a pad in less than a hour or cramping uncontrolled by ibuprofen. She also reports slight swelling in her legs with one a little more than the other. Patient denies pain in back of her calf, redness, tenderness or warmth. Denies pitting edema. Patient also denies headaches, dizziness or blurry vision. Discussed with patient some swelling postpartum is normal and reviewed concerning signs/symptoms. Also discussed normal bleeding patterns postpartum as well and reassurance provided. Patient verbalized understanding to all.

## 2021-06-13 ENCOUNTER — Telehealth (HOSPITAL_COMMUNITY): Payer: Self-pay | Admitting: *Deleted

## 2021-06-13 NOTE — Telephone Encounter (Signed)
Phone voicemail message left to return nurse call.  Duffy Rhody, RN 06-13-2021 at 1:10pm

## 2021-07-04 ENCOUNTER — Other Ambulatory Visit: Payer: Self-pay

## 2021-07-04 ENCOUNTER — Ambulatory Visit (INDEPENDENT_AMBULATORY_CARE_PROVIDER_SITE_OTHER): Payer: Medicaid Other | Admitting: Student

## 2021-07-04 ENCOUNTER — Encounter: Payer: Self-pay | Admitting: Student

## 2021-07-04 NOTE — Progress Notes (Signed)
? ? ?Post Partum Visit Note ? ?Alexandra Lyons is a 21 y.o. G78P1001 female who presents for a postpartum visit. She is 4 weeks 5 days postpartum following a normal spontaneous vaginal delivery.  I have fully reviewed the prenatal and intrapartum course. The delivery was at [redacted]w[redacted]d.  Anesthesia: epidural. Postpartum course has been uncomplicated. Had short episode of sharp pain with sitting in early weeks home from delivery and noted a small blood clot pass. Has not noticed any of these symptoms since. Patient contacted office and was provided reassurance. Baby is doing well since delivery. Baby is feeding by bottle - Jerlyn Ly Start . Bleeding: appears to be brown discharge. Bowel function is normal. Bladder function is normal. Patient is not sexually active. Contraception method is abstinence. Postpartum depression screening: negative. ? ? ?The pregnancy intention screening data noted above was reviewed. Potential methods of contraception were discussed. The patient elected to proceed with No data recorded. ? ? Edinburgh Postnatal Depression Scale - 07/04/21 1614   ? ?  ? Edinburgh Postnatal Depression Scale:  In the Past 7 Days  ? I have been able to laugh and see the funny side of things. 0   ? I have looked forward with enjoyment to things. 0   ? I have blamed myself unnecessarily when things went wrong. 2   ? I have been anxious or worried for no good reason. 3   ? I have felt scared or panicky for no good reason. 0   ? Things have been getting on top of me. 0   ? I have been so unhappy that I have had difficulty sleeping. 1   ? I have felt sad or miserable. 0   ? I have been so unhappy that I have been crying. 1   ? The thought of harming myself has occurred to me. 0   ? Edinburgh Postnatal Depression Scale Total 7   ? ?  ?  ? ?  ? ? ?Health Maintenance Due  ?Topic Date Due  ? COVID-19 Vaccine (1) Never done  ? HPV VACCINES (1 - 2-dose series) Never done  ? ? ?The following portions of the patient's  history were reviewed and updated as appropriate: allergies, current medications, past family history, past medical history, past social history, past surgical history, and problem list. ? ?Review of Systems ?Constitutional: negative for chills and fevers ?Gastrointestinal: positive for constipation, negative for abdominal pain, change in bowel habits, diarrhea, nausea, and vomiting ?Genitourinary:negative for dysuria and urinary incontinence ?Behavioral/Psych: negative for bad mood, depression, and mood swings ? ?Objective:  ?BP 112/61   Pulse 83   Wt 195 lb 12.8 oz (88.8 kg)   LMP 08/19/2020 (Approximate)   Breastfeeding No   BMI 27.31 kg/m?   ? ?General:  alert and no distress  ? Breasts:  deferred  ?Lungs: Clear bilaterally   ?Heart:  S1, S2 normal  ?Abdomen: normal findings: soft, non-tender and symmetric   ?Wound N/a  ?GU exam:   Normal, pink mucosa , skin tissue intact, minor tenderness at left vaginal wall area  ?     ?Assessment:  ? ? There are no diagnoses linked to this encounter. ? ?Healthy postpartum exam.  ? ?Plan:  ? ?Essential components of care per ACOG recommendations: ? ?1.  Mood and well being: Patient with negative depression screening today. Reviewed local resources for support.  ?- Patient tobacco use? Yes. Patient desires to quit? Yes.Discussed reduction and cessation  ?- hx  of drug use? No.   ? ?2. Infant care and feeding:  ?-Patient currently breastmilk feeding? No. Breast comfort measures discussed to support cessation of breast milk production. ?-Social determinants of health (SDOH) reviewed in EPIC. No concerns present. ? ?3. Sexuality, contraception and birth spacing ?- Patient does not want a pregnancy in the next year.  Desired family size is unknown children.  ?- Reviewed reproductive life planning. Reviewed contraceptive methods based on pt preferences and effectiveness.  Patient desired abstinence today.   ?- Discussed birth spacing of 18 months ? ?4. Sleep and  fatigue ?-Encouraged family/partner/community support of 4 hrs of uninterrupted sleep to help with mood and fatigue ? ?5. Physical Recovery  ?- Discussed patients delivery and complications. She describes her labor as mixed. Patient had a difficult time adjusting to healing of sulcus tear and pain management at first. Discussed with patient and provided reassurance. ?- Patient had a Vaginal, no problems at delivery. Patient had a sulcus laceration. Perineal healing reviewed. Patient expressed understanding ?- Patient has urinary incontinence? No. ?- Patient bowel movements have returned to her baseline ?- Patient is safe to resume physical and sexual activity ? ?6.  Health Maintenance ?- HM due items addressed No - not due ?- Last pap smear  ?Diagnosis  ?Date Value Ref Range Status  ?11/23/2020   Final  ? - Negative for intraepithelial lesion or malignancy (NILM)  ? Pap smear not done at today's visit.  ?-Breast Cancer screening indicated? No.  ? ?7. Chronic Disease/Pregnancy Condition follow up: None ? ?- PCP follow up ? ?Johnston Ebbs, NP ?[facility] [place] ? ?

## 2021-12-11 ENCOUNTER — Ambulatory Visit: Payer: Medicaid Other | Admitting: Family Medicine

## 2022-01-16 ENCOUNTER — Encounter: Payer: Self-pay | Admitting: Obstetrics and Gynecology

## 2022-01-16 ENCOUNTER — Ambulatory Visit (INDEPENDENT_AMBULATORY_CARE_PROVIDER_SITE_OTHER): Payer: Medicaid Other | Admitting: Obstetrics and Gynecology

## 2022-01-16 ENCOUNTER — Other Ambulatory Visit (HOSPITAL_COMMUNITY)
Admission: RE | Admit: 2022-01-16 | Discharge: 2022-01-16 | Disposition: A | Discharge status: Home / Self Care | Payer: Medicaid Other | Source: Ambulatory Visit | Attending: Obstetrics and Gynecology | Admitting: Obstetrics and Gynecology

## 2022-01-16 VITALS — BP 95/75 | HR 115 | Ht 71.0 in | Wt 168.7 lb

## 2022-01-16 DIAGNOSIS — A549 Gonococcal infection, unspecified: Secondary | ICD-10-CM

## 2022-01-16 DIAGNOSIS — N760 Acute vaginitis: Secondary | ICD-10-CM | POA: Diagnosis not present

## 2022-01-16 DIAGNOSIS — N926 Irregular menstruation, unspecified: Secondary | ICD-10-CM | POA: Diagnosis not present

## 2022-01-16 DIAGNOSIS — N898 Other specified noninflammatory disorders of vagina: Secondary | ICD-10-CM | POA: Insufficient documentation

## 2022-01-16 DIAGNOSIS — B9689 Other specified bacterial agents as the cause of diseases classified elsewhere: Secondary | ICD-10-CM

## 2022-01-16 NOTE — Progress Notes (Signed)
   GYNECOLOGY OFFICE VISIT NOTE  History:   Alexandra Lyons is a 21 y.o. G1P1001 here today for a few issues:   Vaginal lesion - she thinks she might have a boil at the entrance - no pain related to it.  Vaginal discharge - no odor or itching. Some discomfort with intercourse. Denies PCB. She thinks it started a month ago.  Irregular bleeding - She has been bleeding every 3 weeks. Her periods last 1-2 weeks. They are heavy. It started doing this in July. Her periods started in March after her delivery and were normal until July.     Past Medical History:  Diagnosis Date   Anemia    COVID-19 affecting pregnancy in third trimester 04/14/2021    Past Surgical History:  Procedure Laterality Date   NO PAST SURGERIES      The following portions of the patient's history were reviewed and updated as appropriate: allergies, current medications, past family history, past medical history, past social history, past surgical history and problem list.   Health Maintenance:   Diagnosis  Date Value Ref Range Status  11/23/2020   Final   - Negative for intraepithelial lesion or malignancy (NILM)     Review of Systems:  Pertinent items noted in HPI and remainder of comprehensive ROS otherwise negative.  Physical Exam:  BP 95/75   Pulse (!) 115   Ht 5\' 11"  (1.803 m)   Wt 168 lb 11.2 oz (76.5 kg)   LMP 01/10/2022 (Exact Date)   Breastfeeding No   BMI 23.53 kg/m  CONSTITUTIONAL: Well-developed, well-nourished female in no acute distress.  HEENT:  Normocephalic, atraumatic. External right and left ear normal. No scleral icterus.  NECK: Normal range of motion, supple, no masses noted on observation SKIN: No rash noted. Not diaphoretic. No erythema. No pallor. MUSCULOSKELETAL: Normal range of motion. No edema noted. NEUROLOGIC: Alert and oriented to person, place, and time. Normal muscle tone coordination. No cranial nerve deficit noted. PSYCHIATRIC: Normal mood and affect. Normal  behavior. Normal judgment and thought content.  CARDIOVASCULAR: Normal heart rate noted RESPIRATORY: Effort and breath sounds normal, no problems with respiration noted ABDOMEN: No masses noted. No other overt distention noted.    PELVIC: Normal appearing external genitalia; normal urethral meatus; normal appearing vaginal mucosa and cervix.  Left levator tenderness. Thin, watery yellow discharge noted.  Normal uterine size, no other palpable masses, no uterine or adnexal tenderness. Performed in the presence of a chaperone  Labs and Imaging No results found for this or any previous visit (from the past 168 hour(s)). No results found.  Assessment and Plan:   1. Irregular bleeding May be due to the cause of the discharge. If not, will check TSH and try treatment. She could not stay for blood work today.   2. Vaginal discharge Swabs done today for bv/yeast/trich/gc/ct  3. Vaginal lesion Normal hymenal tissue - patient reassured.   *For levator tenderness, if doesn't resolve I will send to PFPT.    Routine preventative health maintenance measures emphasized. Please refer to After Visit Summary for other counseling recommendations.   No follow-ups on file.  03/12/2022, MD, FACOG Obstetrician & Gynecologist, [facility] for [facility], [facility]

## 2022-01-18 LAB — CERVICOVAGINAL ANCILLARY ONLY
Bacterial Vaginitis (gardnerella): POSITIVE — AB
Candida Glabrata: NEGATIVE
Candida Vaginitis: NEGATIVE
Chlamydia: NEGATIVE
Comment: NEGATIVE
Comment: NEGATIVE
Comment: NEGATIVE
Comment: NEGATIVE
Comment: NEGATIVE
Comment: NORMAL
Neisseria Gonorrhea: POSITIVE — AB
Trichomonas: NEGATIVE

## 2022-01-18 MED ORDER — METRONIDAZOLE 500 MG PO TABS: 500.0000 mg | ORAL_TABLET | Freq: Two times a day (BID) | ORAL | 0 refills | Status: DC

## 2022-01-18 NOTE — Addendum Note (Signed)
Addended by: Milas Hock A on: 01/18/2022 07:17 PM   Modules accepted: Orders

## 2022-01-21 ENCOUNTER — Ambulatory Visit (INDEPENDENT_AMBULATORY_CARE_PROVIDER_SITE_OTHER): Payer: Medicaid Other

## 2022-01-21 ENCOUNTER — Other Ambulatory Visit: Payer: Self-pay

## 2022-01-21 VITALS — BP 100/77 | HR 66 | Ht 71.0 in | Wt 170.6 lb

## 2022-01-21 DIAGNOSIS — A549 Gonococcal infection, unspecified: Secondary | ICD-10-CM | POA: Diagnosis not present

## 2022-01-21 DIAGNOSIS — B9689 Other specified bacterial agents as the cause of diseases classified elsewhere: Secondary | ICD-10-CM

## 2022-01-21 DIAGNOSIS — N760 Acute vaginitis: Secondary | ICD-10-CM

## 2022-01-21 MED ORDER — METRONIDAZOLE 500 MG PO TABS: 500.0000 mg | ORAL_TABLET | Freq: Two times a day (BID) | ORAL | 0 refills | Status: DC

## 2022-01-21 MED ORDER — CEFTRIAXONE SODIUM 500 MG IJ SOLR
500.0000 mg | Freq: Once | INTRAMUSCULAR | Status: AC
  Administered 2022-01-21: 500 mg via INTRAMUSCULAR

## 2022-01-21 NOTE — Progress Notes (Signed)
Alexandra Lyons here for Rocephin  Injection due to positive Gonorrhea infection on 01/16/22 per Dr Damita Dunnings.  Injection administered without complication. Pt tolerated well .Pt states has not picked up Rx for Flagyl due to needing pharmacy change. Rx sent to new pharmacy on file. Pt aware to pick up today. Pt states has talked with partner and will get treated at [facility].   Georgia Lopes, RN 01/21/2022  3:15 PM

## 2022-02-06 ENCOUNTER — Telehealth: Payer: Medicaid Other | Admitting: Physician Assistant

## 2022-02-06 DIAGNOSIS — B379 Candidiasis, unspecified: Secondary | ICD-10-CM

## 2022-02-06 DIAGNOSIS — T3695XA Adverse effect of unspecified systemic antibiotic, initial encounter: Secondary | ICD-10-CM | POA: Diagnosis not present

## 2022-02-07 ENCOUNTER — Other Ambulatory Visit: Payer: Self-pay

## 2022-02-07 MED ORDER — FLUCONAZOLE 150 MG PO TABS
150.0000 mg | ORAL_TABLET | Freq: Once | ORAL | 0 refills | Status: AC
  Filled 2022-02-07: qty 1, 1d supply, fill #0

## 2022-02-07 NOTE — Progress Notes (Signed)
I have spent 5 minutes in review of e-visit questionnaire, review and updating patient chart, medical decision making and response to patient.   Zenovia Justman Cody Xana Bradt, PA-C    

## 2022-02-07 NOTE — Progress Notes (Signed)

## 2022-02-28 ENCOUNTER — Other Ambulatory Visit: Payer: Self-pay

## 2022-04-22 ENCOUNTER — Ambulatory Visit: Payer: Medicaid Other

## 2022-11-05 IMAGING — US US FETAL BPP W/ NON-STRESS
1 series · 10 of 10 positions shown · non-contrast
Comparison: none

[Series 1: us fetal bpp w/ non-stress · 10 acquisitions, 10 frames shown]
[im 1/10]
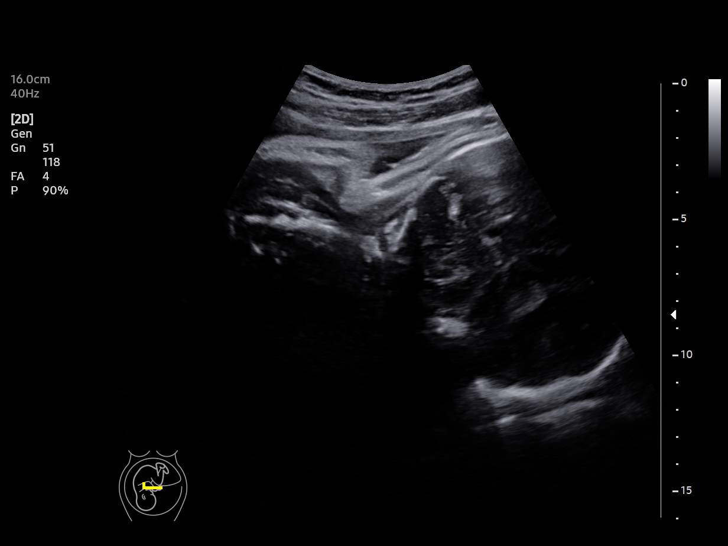
[im 2/10]
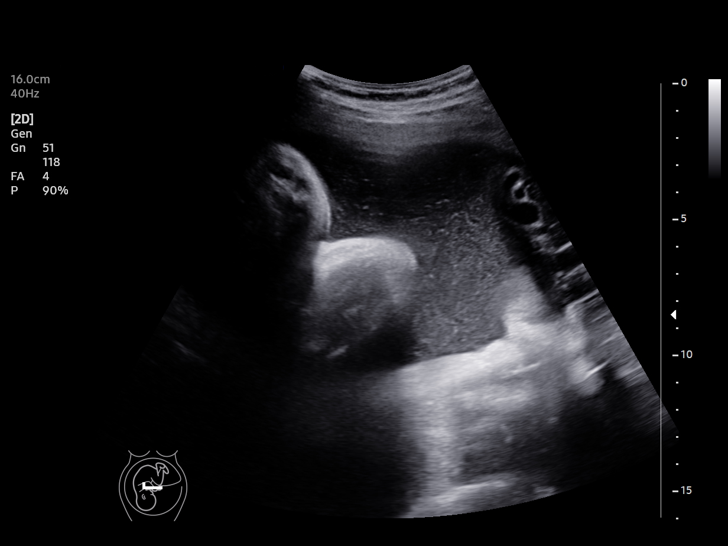
[im 3/10]
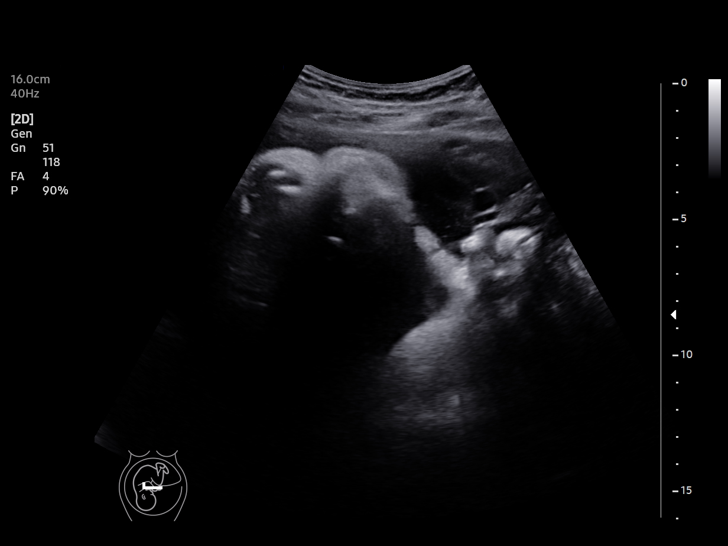
[im 4/10]
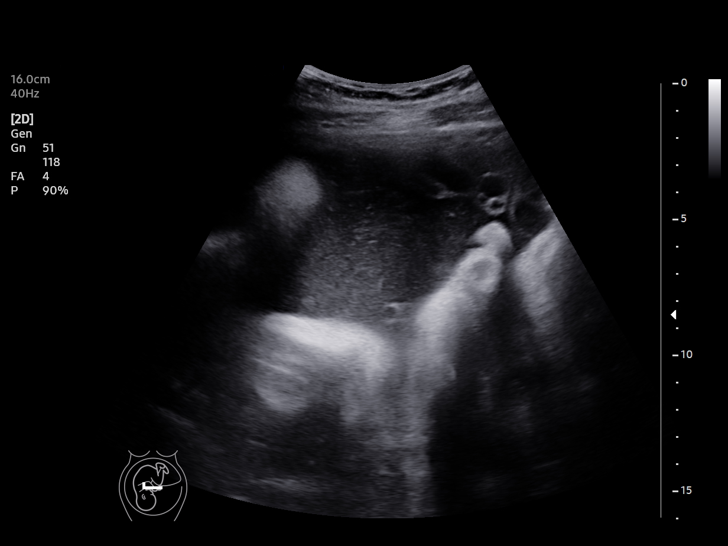
[im 5/10]
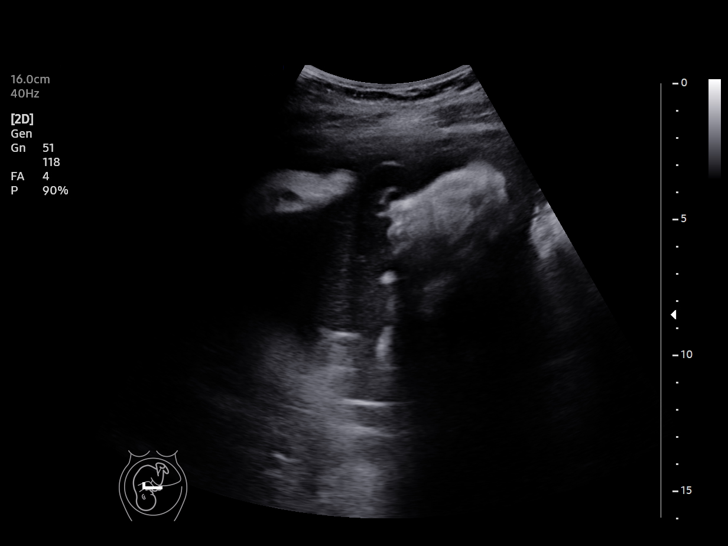
[im 6/10]
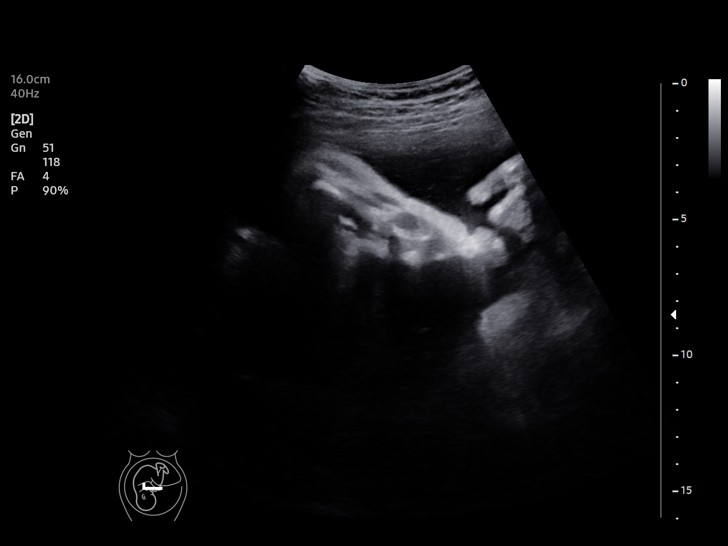
[im 7/10]
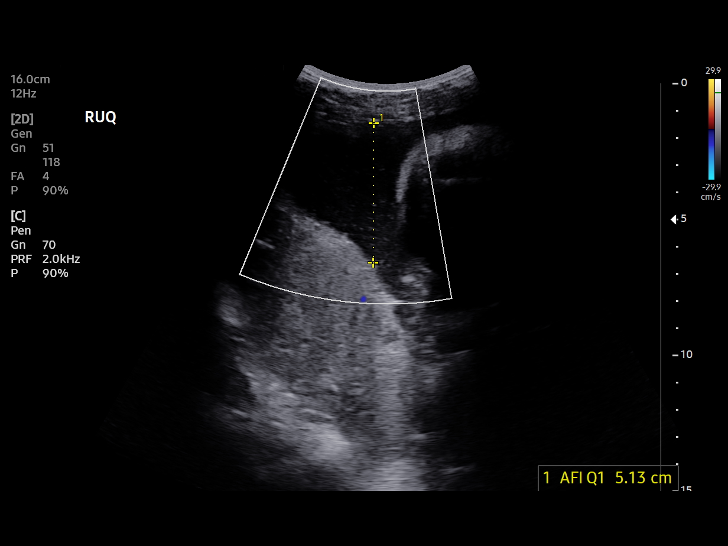
[im 8/10]
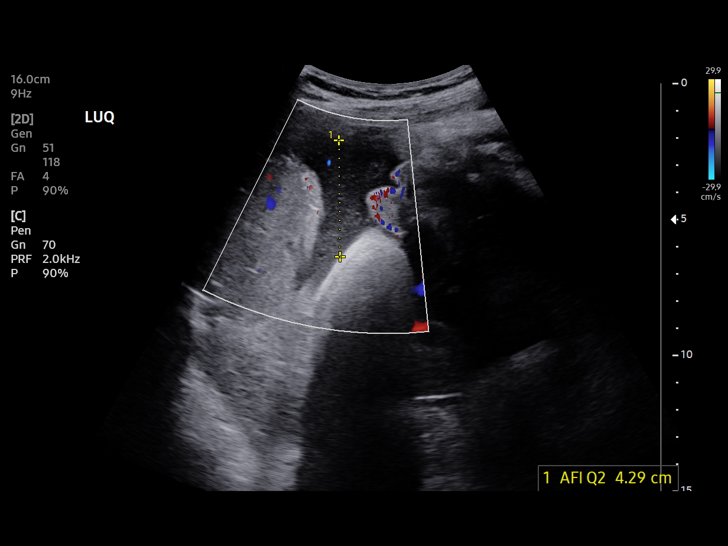
[im 9/10]
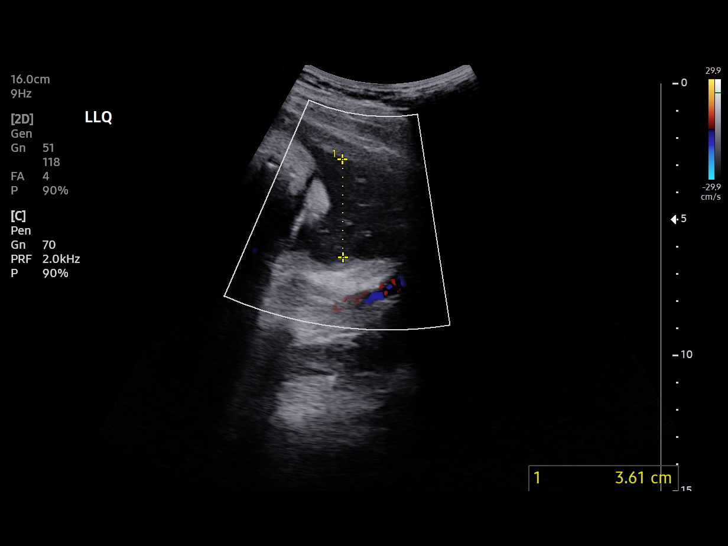
[im 10/10]
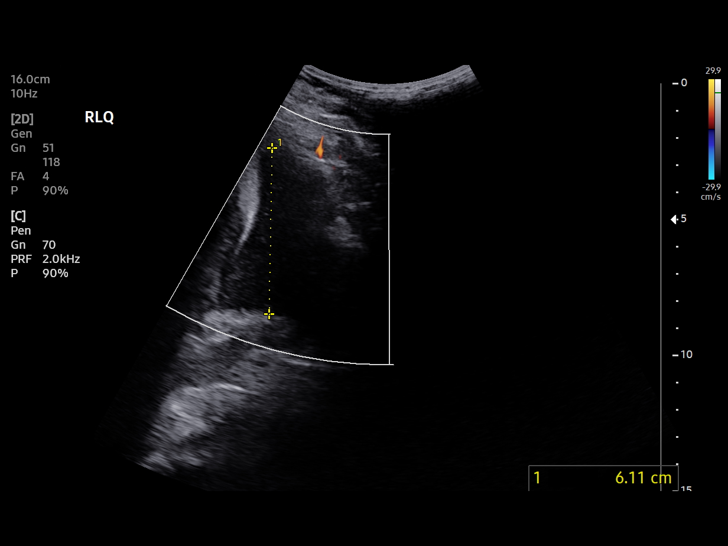

[10 of 10 positions shown; findings below may reference images not displayed]

[REDACTED]care at

 1  US FETAL BPP W/NONSTRESS              76818.4     [facility] TIGER

Indications

 Postdate pregnancy (40-42 weeks)
Fetal Evaluation

 Num Of Fetuses:         1
 Preg. Location:         Intrauterine
 Cardiac Activity:       Observed
 Fetal Lie:              Maternal right side
 Presentation:           Cephalic

 Amniotic Fluid
 AFI FV:      Within normal limits

 AFI Sum(cm)     %Tile       Largest Pocket(cm)
 19.14           90

 RUQ(cm)       RLQ(cm)       LUQ(cm)        LLQ(cm)


 Comment:    Breathing noted intermittently, but not sustained.
Biophysical Evaluation

 Amniotic F.V:   Pocket => 2 cm             F. Tone:        Observed
 F. Movement:    Observed                   N.S.T:          Nonreactive
 F. Breathing:   Not Observed               Score:          [DATE]
OB History
 Blood Type:   B+
 Gravidity:    1
Gestational Age

 LMP:           40w 5d        Date:  08/19/20                 EDD:   05/26/21
 Best:          40w 5d     Det. By:  LMP  (08/19/20)          EDD:   05/26/21
Impression

 IUP at 40 weeks
 NR NST
 BPP [DATE]
Recommendations

 To Hospital for delivery
                  Ansah, Aziz Mohammad

## 2023-03-29 ENCOUNTER — Emergency Department
Admission: EM | Admit: 2023-03-29 | Discharge: 2023-03-30 | Disposition: A | Discharge status: Home / Self Care | Payer: Medicaid Other | Attending: Emergency Medicine | Admitting: Emergency Medicine

## 2023-03-29 ENCOUNTER — Encounter: Payer: Self-pay | Admitting: Emergency Medicine

## 2023-03-29 ENCOUNTER — Other Ambulatory Visit: Payer: Self-pay

## 2023-03-29 DIAGNOSIS — A64 Unspecified sexually transmitted disease: Secondary | ICD-10-CM | POA: Insufficient documentation

## 2023-03-29 DIAGNOSIS — L989 Disorder of the skin and subcutaneous tissue, unspecified: Secondary | ICD-10-CM | POA: Insufficient documentation

## 2023-03-29 DIAGNOSIS — Z202 Contact with and (suspected) exposure to infections with a predominantly sexual mode of transmission: Secondary | ICD-10-CM | POA: Diagnosis present

## 2023-03-29 DIAGNOSIS — B9689 Other specified bacterial agents as the cause of diseases classified elsewhere: Secondary | ICD-10-CM

## 2023-03-29 DIAGNOSIS — N760 Acute vaginitis: Secondary | ICD-10-CM | POA: Diagnosis not present

## 2023-03-29 LAB — WET PREP, GENITAL
Sperm: NONE SEEN
Trich, Wet Prep: NONE SEEN
WBC, Wet Prep HPF POC: >10 — AB (ref <10)
Yeast Wet Prep HPF POC: NONE SEEN

## 2023-03-29 MED ORDER — CEFTRIAXONE SODIUM 1 G IJ SOLR
500.0000 mg | Freq: Once | INTRAMUSCULAR | Status: AC
  Administered 2023-03-29: 500 mg via INTRAMUSCULAR
  Filled 2023-03-29: qty 10

## 2023-03-29 MED ORDER — METRONIDAZOLE 500 MG PO TABS: 500.0000 mg | ORAL_TABLET | Freq: Three times a day (TID) | ORAL | 0 refills | Status: AC

## 2023-03-29 MED ORDER — DOXYCYCLINE HYCLATE 100 MG PO TABS: 100.0000 mg | ORAL_TABLET | Freq: Two times a day (BID) | ORAL | 0 refills | Status: AC

## 2023-03-29 MED ORDER — LIDOCAINE HCL (PF) 1 % IJ SOLN
5.0000 mL | Freq: Once | INTRAMUSCULAR | Status: AC
  Administered 2023-03-30: 5 mL
  Filled 2023-03-29: qty 5

## 2023-03-29 NOTE — Discharge Instructions (Addendum)
You were evaluated in the ED for inner thigh bumps.  We performed a STD panel and wet prep.  Your STD panel is still pending.  We are going to go ahead and treat you for these STDs.  Your wet prep revealed clue cells indicating BV in which she will need to pick up antibiotics for this as well.  He is complete antibiotics until bottle is completely empty.  Refrain from shaving until bumps heal.  Follow-up with your OB/GYN.

## 2023-03-29 NOTE — ED Provider Notes (Incomplete)
[facility] Emergency Department Provider Note     Event Date/Time   First MD Initiated Contact with Patient 03/29/23 2240     (approximate)   History   No chief complaint on file.   HPI  Alexandra Lyons is a 22 y.o. female ***       Physical Exam   Triage Vital Signs: ED Triage Vitals  Encounter Vitals Group     BP 03/29/23 2025 (!) 145/56     Systolic BP Percentile --      Diastolic BP Percentile --      Pulse Rate 03/29/23 2025 64     Resp 03/29/23 2025 16     Temp 03/29/23 2025 98.7 F (37.1 C)     Temp Source 03/29/23 2025 Oral     SpO2 03/29/23 2026 100 %     Weight 03/29/23 2026 160 lb (72.6 kg)     Height 03/29/23 2026 5\' 11"  (1.803 m)     Head Circumference --      Peak Flow --      Pain Score 03/29/23 2026 0     Pain Loc --      Pain Education --      Exclude from Growth Chart --     Most recent vital signs: Vitals:   03/29/23 2025 03/29/23 2026  BP: (!) 145/56   Pulse: 64   Resp: 16   Temp: 98.7 F (37.1 C)   SpO2:  100%    General Awake, no distress. *** {**HEENT NCAT. PERRL. EOMI. No rhinorrhea. Mucous membranes are moist. **} CV:  Good peripheral perfusion. *** RESP:  Normal effort. *** ABD:  No distention. *** {**Other: **}   ED Results / Procedures / Treatments   Labs (all labs ordered are listed, but only abnormal results are displayed) Labs Reviewed  WET PREP, GENITAL - Abnormal; Notable for the following components:      Result Value   Clue Cells Wet Prep HPF POC PRESENT (*)    WBC, Wet Prep HPF POC >10 (*)    All other components within normal limits  CHLAMYDIA/NGC RT PCR ([facility] ONLY)              EKG  ***  RADIOLOGY  {**I personally viewed and evaluated these images as part of my medical decision making, as well as reviewing the written report by the radiologist.  ED Provider Interpretation: ***  No results found.  PROCEDURES:  Critical Care performed:  {CriticalCareYesNo:19197::"Yes, see critical care procedure note(s)","No"}  Procedures   MEDICATIONS ORDERED IN ED: Medications  cefTRIAXone (ROCEPHIN) injection 500 mg (has no administration in time range)     IMPRESSION / MDM / ASSESSMENT AND PLAN / ED COURSE  I reviewed the triage vital signs and the nursing notes.                                 22 y.o. female presents to the emergency department for evaluation and treatment of ***. See HPI for further details.   Differential diagnosis includes, but is not limited to ***   Patient's presentation is most consistent with {EM COPA:27473}  {**The patient is on the cardiac monitor to evaluate for evidence of arrhythmia and/or significant heart rate changes.**}  The patient is in stable and satisfactory condition for discharge home. Encouraged to follow up with *** for further management. ED precautions discussed. All  questions and concerns were addressed during this ED visit.     FINAL CLINICAL IMPRESSION(S) / ED DIAGNOSES   Final diagnoses:  Bacterial vaginosis  STD (sexually transmitted disease)  Bumps on skin     Rx / DC Orders   ED Discharge Orders          Ordered    doxycycline (VIBRA-TABS) 100 MG tablet  2 times daily        03/29/23 2346    metroNIDAZOLE (FLAGYL) 500 MG tablet  3 times daily        03/29/23 2347             Note:  This document was prepared using Dragon voice recognition software and may include unintentional dictation errors.

## 2023-03-29 NOTE — ED Provider Notes (Signed)
[facility] Emergency Department Provider Note     Event Date/Time   First MD Initiated Contact with Patient 03/29/23 2240     (approximate)   History   No chief complaint on file.   HPI  Alexandra Lyons is a 22 y.o. female presents for evaluation of blood in her groin area and STD exposure.  Patient reports she has been having unprotected sex with a new partner was recently diagnosed with chlamydia and gonorrhea without being provided medication.  She reports she noticed 3 bumps appear on her inner thigh and what she believes may be herpes.     Physical Exam   Triage Vital Signs: ED Triage Vitals  Encounter Vitals Group     BP 03/29/23 2025 (!) 145/56     Systolic BP Percentile --      Diastolic BP Percentile --      Pulse Rate 03/29/23 2025 64     Resp 03/29/23 2025 16     Temp 03/29/23 2025 98.7 F (37.1 C)     Temp Source 03/29/23 2025 Oral     SpO2 03/29/23 2026 100 %     Weight 03/29/23 2026 160 lb (72.6 kg)     Height 03/29/23 2026 5\' 11"  (1.803 m)     Head Circumference --      Peak Flow --      Pain Score 03/29/23 2026 0     Pain Loc --      Pain Education --      Exclude from Growth Chart --     Most recent vital signs: Vitals:   03/29/23 2025 03/29/23 2026  BP: (!) 145/56   Pulse: 64   Resp: 16   Temp: 98.7 F (37.1 C)   SpO2:  100%    General Awake, no distress.  HEENT NCAT. PERRL. EOMI. No rhinorrhea. Mucous membranes are moist.  CV:  Good peripheral perfusion.  RESP:  Normal effort.  ABD:  No distention.  GU:  Moderate amount of yellow-whitish discharge can be seen on the external vulva.  There appears to be 1 ingrown hair on the superior right side of the vulva and 2 small abrasions similar in resemblance to a small nick from a razor.   ED Results / Procedures / Treatments   Labs (all labs ordered are listed, but only abnormal results are displayed) Labs Reviewed  WET PREP, GENITAL - Abnormal; Notable  for the following components:      Result Value   Clue Cells Wet Prep HPF POC PRESENT (*)    WBC, Wet Prep HPF POC >10 (*)    All other components within normal limits  CHLAMYDIA/NGC RT PCR ([facility] ONLY)             No results found.  PROCEDURES:  Critical Care performed: No  Procedures   MEDICATIONS ORDERED IN ED: Medications  cefTRIAXone (ROCEPHIN) injection 500 mg (has no administration in time range)     IMPRESSION / MDM / ASSESSMENT AND PLAN / ED COURSE  I reviewed the triage vital signs and the nursing notes.                               22 y.o. female presents to the emergency department for evaluation and treatment of exposure to STD and bumps on inner thigh. See HPI for further details.   Differential diagnosis includes, but is not  limited to herpes, chlamydia/gonorrhea, abrasion, folliculitis  Patient's presentation is most consistent with acute complicated illness / injury requiring diagnostic workup.  Patient is alert and oriented and.  She is afebrile.  Wet prep reveals clue cells indicating BV.  Plan to prescribe Flagyl.  Chlamydia gonorrhea still pending.  However, given patient's positive gonorrhea and chlamydia test from a [facility] STD check system, I will go ahead and treat for both chlamydia and gonorrhea empirically and given discolored discharge on GU exam.  Patient provided with education on safe sex practices is.  She is in stable and satisfactory condition for discharge home. Encouraged to follow up with OB/GYN for further management. ED precautions discussed. All questions and concerns were addressed during this ED visit.     FINAL CLINICAL IMPRESSION(S) / ED DIAGNOSES   Final diagnoses:  Bacterial vaginosis  STD (sexually transmitted disease)  Bumps on skin     Rx / DC Orders   ED Discharge Orders          Ordered    doxycycline (VIBRA-TABS) 100 MG tablet  2 times daily        03/29/23 2346    metroNIDAZOLE (FLAGYL) 500 MG tablet  3  times daily        03/29/23 2347             Note:  This document was prepared using Dragon voice recognition software and may include unintentional dictation errors.    Romeo Apple, Seher Schlagel A, PA-C 03/30/23 0231    Concha Se, MD 03/30/23 [phone]

## 2023-03-29 NOTE — ED Triage Notes (Signed)
Pt presents to Ed reports she may have exposed to STD, reports she got tested at [facility] last Thursday, reports he has Chlamydia and Gonorrhea, reports today she noted some blisters in her inner thigh area. Pt talks in complete sentences no distress noted

## 2023-03-30 LAB — CHLAMYDIA/NGC RT PCR (ARMC ONLY)
Chlamydia Tr: DETECTED — AB
N gonorrhoeae: DETECTED — AB

## 2023-04-07 ENCOUNTER — Telehealth: Payer: Self-pay | Admitting: Emergency Medicine

## 2023-04-07 NOTE — Telephone Encounter (Signed)
Patient left a message last week in response to a message I sent via mychart. She did not know where her prescriptions went.  She thought they went to [facility], but not there.  She did not answer when I called her back, but left a message that it was [facility].  She called back today, and says she still does not have medications. She needs them sent to [facility].  I [facility] to [facility]. I had told the patient to wait an hour and then go to the store and call me if any problems.

## 2023-04-10 ENCOUNTER — Ambulatory Visit: Payer: Medicaid Other

## 2023-08-04 ENCOUNTER — Other Ambulatory Visit (HOSPITAL_BASED_OUTPATIENT_CLINIC_OR_DEPARTMENT_OTHER): Payer: Self-pay
